# Patient Record
Sex: Male | Born: 1960
Health system: Southern US, Community
[De-identification: ages and names within clinical notes are randomized; demographics above are authoritative.]

## PROBLEM LIST (undated history)

## (undated) DIAGNOSIS — Z114 Encounter for screening for human immunodeficiency virus [HIV]: Secondary | ICD-10-CM

## (undated) DIAGNOSIS — F32A Depression, unspecified: Secondary | ICD-10-CM

## (undated) DIAGNOSIS — K219 Gastro-esophageal reflux disease without esophagitis: Secondary | ICD-10-CM

## (undated) DIAGNOSIS — M1990 Unspecified osteoarthritis, unspecified site: Secondary | ICD-10-CM

## (undated) DIAGNOSIS — E785 Hyperlipidemia, unspecified: Secondary | ICD-10-CM

## (undated) HISTORY — PX: BACK SURGERY: SHX140

## (undated) HISTORY — DX: Hyperlipidemia, unspecified: E78.5

## (undated) HISTORY — DX: Gastro-esophageal reflux disease without esophagitis: K21.9

---

## 1999-04-17 ENCOUNTER — Encounter: Payer: Self-pay | Admitting: Neurosurgery

## 1999-04-17 ENCOUNTER — Ambulatory Visit (HOSPITAL_COMMUNITY): Admission: RE | Admit: 1999-04-17 | Discharge: 1999-04-18 | Payer: Self-pay | Admitting: Neurosurgery

## 2004-03-07 ENCOUNTER — Emergency Department (HOSPITAL_COMMUNITY): Admission: EM | Admit: 2004-03-07 | Discharge: 2004-03-07 | Payer: Self-pay | Admitting: Emergency Medicine

## 2004-05-25 ENCOUNTER — Emergency Department (HOSPITAL_COMMUNITY): Admission: EM | Admit: 2004-05-25 | Discharge: 2004-05-25 | Payer: Self-pay | Admitting: Emergency Medicine

## 2004-06-15 ENCOUNTER — Emergency Department (HOSPITAL_COMMUNITY): Admission: EM | Admit: 2004-06-15 | Discharge: 2004-06-15 | Payer: Self-pay | Admitting: Emergency Medicine

## 2010-10-08 ENCOUNTER — Inpatient Hospital Stay (INDEPENDENT_AMBULATORY_CARE_PROVIDER_SITE_OTHER)
Admission: RE | Admit: 2010-10-08 | Discharge: 2010-10-08 | Disposition: A | Discharge status: Home / Self Care | Payer: Self-pay | Source: Ambulatory Visit | Attending: Family Medicine | Admitting: Family Medicine

## 2010-10-08 DIAGNOSIS — L02519 Cutaneous abscess of unspecified hand: Secondary | ICD-10-CM

## 2010-10-08 DIAGNOSIS — L03119 Cellulitis of unspecified part of limb: Secondary | ICD-10-CM

## 2010-10-11 LAB — WOUND CULTURE

## 2014-03-08 ENCOUNTER — Emergency Department (HOSPITAL_COMMUNITY)
Admission: EM | Admit: 2014-03-08 | Discharge: 2014-03-08 | Disposition: A | Discharge status: Home / Self Care | Payer: Self-pay | Attending: Family Medicine | Admitting: Family Medicine

## 2014-03-08 ENCOUNTER — Encounter (HOSPITAL_COMMUNITY): Payer: Self-pay | Admitting: Emergency Medicine

## 2014-03-08 DIAGNOSIS — F17200 Nicotine dependence, unspecified, uncomplicated: Secondary | ICD-10-CM | POA: Insufficient documentation

## 2014-03-08 DIAGNOSIS — M1990 Unspecified osteoarthritis, unspecified site: Secondary | ICD-10-CM

## 2014-03-08 DIAGNOSIS — M129 Arthropathy, unspecified: Secondary | ICD-10-CM | POA: Insufficient documentation

## 2014-03-08 DIAGNOSIS — Z5900 Homelessness unspecified: Secondary | ICD-10-CM | POA: Insufficient documentation

## 2014-03-08 DIAGNOSIS — R062 Wheezing: Secondary | ICD-10-CM | POA: Insufficient documentation

## 2014-03-08 DIAGNOSIS — Z79899 Other long term (current) drug therapy: Secondary | ICD-10-CM | POA: Insufficient documentation

## 2014-03-08 DIAGNOSIS — Z4802 Encounter for removal of sutures: Secondary | ICD-10-CM | POA: Insufficient documentation

## 2014-03-08 HISTORY — DX: Unspecified osteoarthritis, unspecified site: M19.90

## 2014-03-08 LAB — I-STAT CHEM 8, ED
BUN: 10 mg/dL (ref 6–23)
CALCIUM ION: 1.11 mmol/L — AB (ref 1.12–1.23)
Chloride: 103 mEq/L (ref 96–112)
Creatinine, Ser: 1.3 mg/dL (ref 0.50–1.35)
Glucose, Bld: 125 mg/dL — ABNORMAL HIGH (ref 70–99)
HCT: 54 % — ABNORMAL HIGH (ref 39.0–52.0)
Hemoglobin: 18.4 g/dL — ABNORMAL HIGH (ref 13.0–17.0)
Potassium: 5.1 mEq/L (ref 3.7–5.3)
Sodium: 138 mEq/L (ref 137–147)
TCO2: 28 mmol/L (ref 0–100)

## 2014-03-08 MED ORDER — KETOROLAC TROMETHAMINE 60 MG/2ML IM SOLN
60.0000 mg | Freq: Once | INTRAMUSCULAR | Status: AC
  Administered 2014-03-08: 60 mg via INTRAMUSCULAR
  Filled 2014-03-08: qty 2

## 2014-03-08 MED ORDER — MELOXICAM 7.5 MG PO TABS: 7.5000 mg | ORAL_TABLET | Freq: Every day | ORAL | Status: DC

## 2014-03-08 MED ORDER — DICLOFENAC SODIUM 1 % TD GEL: 2.0000 g | Freq: Four times a day (QID) | TRANSDERMAL | Status: DC

## 2014-03-08 NOTE — Discharge Instructions (Signed)
You are sufferinfg from arthritis. In 24 hours please start the meloxicam Please use the voltaren gel as needed for pain if you cannot take the meloxicam Please follow up with a regular doctor soon Perform daily stretching

## 2014-03-08 NOTE — ED Provider Notes (Signed)
CSN: [id number]     Arrival date & time 03/08/14  0932 History   First MD Initiated Contact with Patient 03/08/14 [phone]     No chief complaint on file.    (Consider location/radiation/quality/duration/timing/severity/associated sxs/prior Treatment) HPI  Multiple joint pains including R knee and hip and shoulders bilat. Ongoing for years. worsk manual labor. Pain typically relieved by aspirin 400mg . Pain is worse w/ increased movement. Achy pains. No radiation.  Staples in head. Laceration from hitting head on car during fall. Seen in [facility] and had staples placed in head. Tetanus shot at the time. Placed 14 days ago. Denies pian or discharge.   Past Medical History  Diagnosis Date  . Arthritis    Past Surgical History  Procedure Laterality Date  . Back surgery     No family history on file. History  Substance Use Topics  . Smoking status: Current Every Day Smoker -- 1.00 packs/day  . Smokeless tobacco: Not on file  . Alcohol Use: Yes    Review of Systems  Respiratory: Negative for chest tightness, wheezing and stridor.   Cardiovascular: Negative for chest pain and leg swelling.  Skin: Negative for rash and wound.  Neurological: Negative for headaches.  All other systems reviewed and are negative.     Allergies  Review of patient's allergies indicates no known allergies.  Home Medications   Prior to Admission medications   Medication Sig Start Date End Date Taking? Authorizing Provider  diclofenac sodium (VOLTAREN) 1 % GEL Apply 2 g topically 4 (four) times daily. 03/08/14   Waldemar Dickens, MD  meloxicam (MOBIC) 7.5 MG tablet Take 1 tablet (7.5 mg total) by mouth daily. 03/08/14   Waldemar Dickens, MD   BP 107/70  Pulse 74  Temp(Src) 97.4 F (36.3 C) (Oral)  SpO2 98% Physical Exam  Constitutional: He is oriented to person, place, and time. He appears well-developed and well-nourished. No distress.  HENT:  Head: Normocephalic and atraumatic.  Eyes: EOM are normal.  Pupils are equal, round, and reactive to light.  Neck: Normal range of motion.  Cardiovascular: Normal rate and normal heart sounds.   No murmur heard. Pulmonary/Chest: Effort normal. He has wheezes.  Abdominal: Soft. He exhibits no distension.  Musculoskeletal:  FROM. No effusions. nonttp (shoulders, knees, hip). Lachmann's neg bilat.   Neurological: He is alert and oriented to person, place, and time.  Skin: Skin is warm. He is not diaphoretic.  Psychiatric: He has a normal mood and affect. His behavior is normal. Judgment and thought content normal.    ED Course  Procedures (including critical care time) Labs Review Labs Reviewed  I-STAT CHEM 8, ED - Abnormal; Notable for the following:    Glucose, Bld 125 (*)    Calcium, Ion 1.11 (*)    Hemoglobin 18.4 (*)    HCT 54.0 (*)    All other components within normal limits    Imaging Review No results found.   EKG Interpretation None      MDM   Final diagnoses:  Arthritis  Removal of staples  Homeless   Arthralgias all likely from arthritis and living outside and on the ground. Unlikely PMR but possible  CHem 8 w/ nml renal fxn Toradol in office Start meloxicam in 24 hrs.  Voltaren gel Pt states he will be able to get meds through his pharmacy.  2 Staples removed from head w/o incident. Well healing scar on head w/ flaking scab. Precautions given and all questions answered  Shanon Brow  Marily Memos, MD Family Medicine 03/08/2014, 9:56 AM     Waldemar Dickens, MD 03/08/14 [phone]

## 2014-03-08 NOTE — ED Notes (Signed)
Pt c/o bilateral shoulder pain and right hip and knee pain x 3-4 weeks.No PCP. Had back surgery in 2004 here, per NS. ALSO-- pt has 2 staples in scalp from 1 week ago from outside facility.

## 2016-04-15 LAB — GLUCOSE, POCT (MANUAL RESULT ENTRY): POC GLUCOSE: 135 mg/dL — AB (ref 70–99)

## 2017-02-16 ENCOUNTER — Emergency Department (HOSPITAL_COMMUNITY): Payer: Self-pay

## 2017-02-16 ENCOUNTER — Encounter (HOSPITAL_COMMUNITY): Payer: Self-pay

## 2017-02-16 ENCOUNTER — Emergency Department (HOSPITAL_COMMUNITY)
Admission: EM | Admit: 2017-02-16 | Discharge: 2017-02-16 | Disposition: A | Discharge status: Home / Self Care | Payer: Self-pay | Attending: Emergency Medicine | Admitting: Emergency Medicine

## 2017-02-16 ENCOUNTER — Ambulatory Visit: Payer: Self-pay | Admitting: Emergency Medicine

## 2017-02-16 DIAGNOSIS — Y9269 Other specified industrial and construction area as the place of occurrence of the external cause: Secondary | ICD-10-CM | POA: Insufficient documentation

## 2017-02-16 DIAGNOSIS — S61442A Puncture wound with foreign body of left hand, initial encounter: Secondary | ICD-10-CM | POA: Insufficient documentation

## 2017-02-16 DIAGNOSIS — Y992 Volunteer activity: Secondary | ICD-10-CM | POA: Insufficient documentation

## 2017-02-16 DIAGNOSIS — Z79899 Other long term (current) drug therapy: Secondary | ICD-10-CM | POA: Insufficient documentation

## 2017-02-16 DIAGNOSIS — Z23 Encounter for immunization: Secondary | ICD-10-CM | POA: Insufficient documentation

## 2017-02-16 DIAGNOSIS — Y9389 Activity, other specified: Secondary | ICD-10-CM | POA: Insufficient documentation

## 2017-02-16 DIAGNOSIS — W294XXA Contact with nail gun, initial encounter: Secondary | ICD-10-CM | POA: Insufficient documentation

## 2017-02-16 DIAGNOSIS — S60552A Superficial foreign body of left hand, initial encounter: Secondary | ICD-10-CM

## 2017-02-16 DIAGNOSIS — F17200 Nicotine dependence, unspecified, uncomplicated: Secondary | ICD-10-CM | POA: Insufficient documentation

## 2017-02-16 MED ORDER — OXYCODONE-ACETAMINOPHEN 5-325 MG PO TABS
1.0000 | ORAL_TABLET | Freq: Once | ORAL | Status: AC
  Administered 2017-02-16: 1 via ORAL
  Filled 2017-02-16: qty 1

## 2017-02-16 MED ORDER — CEFAZOLIN SODIUM-DEXTROSE 2-4 GM/100ML-% IV SOLN
2.0000 g | Freq: Once | INTRAVENOUS | Status: AC
  Administered 2017-02-16: 2 g via INTRAVENOUS
  Filled 2017-02-16: qty 100

## 2017-02-16 MED ORDER — ACETAMINOPHEN 325 MG PO TABS: 650.0000 mg | ORAL_TABLET | Freq: Four times a day (QID) | ORAL | Status: DC | PRN

## 2017-02-16 MED ORDER — LIDOCAINE HCL 2 % IJ SOLN
10.0000 mL | Freq: Once | INTRAMUSCULAR | Status: AC
  Administered 2017-02-16: 200 mg via INTRADERMAL
  Filled 2017-02-16: qty 20

## 2017-02-16 MED ORDER — IBUPROFEN 200 MG PO TABS: 600.0000 mg | ORAL_TABLET | Freq: Four times a day (QID) | ORAL | Status: DC | PRN

## 2017-02-16 MED ORDER — TETANUS-DIPHTH-ACELL PERTUSSIS 5-2.5-18.5 LF-MCG/0.5 IM SUSP
0.5000 mL | Freq: Once | INTRAMUSCULAR | Status: AC
  Administered 2017-02-16: 0.5 mL via INTRAMUSCULAR
  Filled 2017-02-16: qty 0.5

## 2017-02-16 MED ORDER — CEPHALEXIN 500 MG PO CAPS: 500.0000 mg | ORAL_CAPSULE | Freq: Four times a day (QID) | ORAL | 0 refills | Status: AC

## 2017-02-16 NOTE — ED Triage Notes (Signed)
Pt presents for evaluation of nail to L hand/wrist. Pt was volunteering building homes and was using nail gun when a nail flew into skin. Positive PMS to L hand.

## 2017-02-16 NOTE — ED Provider Notes (Signed)
[facility] DEPT Provider Note   CSN: [id number] Arrival date & time: 02/16/17  1156  By signing my name below, I, Ephriam Jenkins, attest that this documentation has been prepared under the direction and in the presence of [facility] PA-C.  Electronically Signed: Ephriam Jenkins, ED Scribe. 02/16/17. 2:31 PM.  History   Chief Complaint Chief Complaint  Patient presents with  . nail in hand   HPI HPI Comments: Darren Wells is a 56 y.o. male who presents to the Emergency Department s/p an injury that occurred at approximately 1030 this morning. 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 He was using a nail gun when a nail accidentally punctured his left wrist. The nail punctured the volar aspect of the radial left wrist, and is still currently placed in the skin. The nail did not travel all the way through the wrist but pt states that he can feel the tip of the nail on the opposite side. He describes moderate, throbbing pain to the left wrist, with intermittent radiation of pain up to his left elbow. The pain is exacerbated with any movement of the wrist. His wrist pain is also exacerbated with movement of the fingers, significantly worse with movement of left thumb. Pt has limited ROM of the left wrist and digits of the left hand, secondary to pain. He denies any pain in the left hand or fingers. He denies any numbness or tingling. Pt is not UTD with Tetanus immunization. Pt is right hand dominant.   The history is provided by the patient. No language interpreter was used.    Past Medical History:  Diagnosis Date  . Arthritis     There are no active problems to display for this patient.   Past Surgical History:  Procedure Laterality Date  . BACK SURGERY         Home Medications    Prior to Admission medications   Medication Sig Start Date End Date Taking? Authorizing Provider  acetaminophen (TYLENOL) 325 MG tablet Take 2 tablets (650 mg total) by mouth every 6 (six) hours  as needed for mild pain or moderate pain. 02/16/17   Milly Jakob, MD  cephALEXin (KEFLEX) 500 MG capsule Take 1 capsule (500 mg total) by mouth 4 (four) times daily. 02/16/17 02/23/17  Rodell Perna A, PA-C  diclofenac sodium (VOLTAREN) 1 % GEL Apply 2 g topically 4 (four) times daily. 03/08/14   Waldemar Dickens, MD  ibuprofen (ADVIL) 200 MG tablet Take 3 tablets (600 mg total) by mouth every 6 (six) hours as needed for mild pain or moderate pain. 02/16/17   Milly Jakob, MD  meloxicam (MOBIC) 7.5 MG tablet Take 1 tablet (7.5 mg total) by mouth daily. 03/08/14   Waldemar Dickens, MD    Family History No family history on file.  Social History Social History  Substance Use Topics  . Smoking status: Current Every Day Smoker    Packs/day: 1.00  . Smokeless tobacco: Not on file  . Alcohol use Yes     Allergies   Patient has no known allergies.   Review of Systems Review of Systems  Musculoskeletal: Positive for arthralgias and myalgias. Negative for joint swelling.  Skin: Positive for wound.  Neurological: Negative for weakness and numbness.  Hematological: Does not bruise/bleed easily.     Physical Exam Updated Vital Signs BP 113/65 (BP Location: Right Arm)   Pulse 72   Temp 98.2 F (36.8 C) (Oral)   Resp 17   SpO2 98%  Physical Exam  Constitutional: He appears well-developed.  HENT:  Head: Normocephalic.  Eyes: Conjunctivae are normal.  Neck: Neck supple.  Cardiovascular: Normal rate, regular rhythm and intact distal pulses.   No murmur heard. 2+ radial pulses bilaterally  Pulmonary/Chest: Effort normal.  Abdominal: Soft. He exhibits no distension.  Musculoskeletal:  Nail located at the volar aspect of the left wrist near the radial styloid. I'm able to palpate the tip of the nail on the dorsum of the left hand, but it has not broken skin. No bleeding. Decreased range of motion of the left thumb secondary to pain. 5/5 strength of digits with flexion and extension  against resistance. Limited range of motion of the wrist due to pain and presence of foreign body. No tenderness on palpation of the left forearm. Normal range of motion of the elbow. No crepitus or swelling noted.  Neurological: He is alert.  Fluent speech, no facial droop, sensation intact to soft touch of bilateral upper extremities  Skin: Skin is warm and dry. Capillary refill takes less than 2 seconds.  Psychiatric: His behavior is normal.  Nursing note and vitals reviewed.    ED Treatments / Results  DIAGNOSTIC STUDIES: Oxygen Saturation is 97% on RA, normal by my interpretation.  COORDINATION OF CARE: 2:31 PM-Discussed treatment plan with pt at bedside and pt agreed to plan.   Labs (all labs ordered are listed, but only abnormal results are displayed) Labs Reviewed - No data to display  EKG  EKG Interpretation None       Radiology Dg Wrist Complete Left  Addendum Date: 02/16/2017   ADDENDUM REPORT: 02/16/2017 15:38 ADDENDUM: Comment:  Ulnar deviation scaphoid image also obtained. Electronically Signed   By: Lowella Grip III M.D.   On: 02/16/2017 15:38   Result Date: 02/16/2017 CLINICAL DATA:  Injury with nail gun EXAM: LEFT WRIST - COMPLETE 3+ VIEW COMPARISON:  None. FINDINGS: Frontal, oblique, and lateral views were obtained. There is a nail in the lateral aspect of the wrist region, located lateral to the distal scaphoid bone. The nail is not seen within bone. There is no evident fracture or dislocation. There is a small nonmetallic radiopaque foreign body lateral to the distal scaphoid bone. Joint spaces appear normal. No erosive change. IMPRESSION: Nail in soft tissues lateral to scaphoid bone without traversing bone. Small nonmetallic radiopaque foreign body lateral to the scaphoid. No evident fracture or dislocation. No appreciable arthropathy. Electronically Signed: By: Lowella Grip III M.D. On: 02/16/2017 15:31    Procedures Procedures (including critical  care time)  Medications Ordered in ED Medications  Tdap (BOOSTRIX) injection 0.5 mL (0.5 mLs Intramuscular Given 02/16/17 1431)  oxyCODONE-acetaminophen (PERCOCET/ROXICET) 5-325 MG per tablet 1 tablet (1 tablet Oral Given 02/16/17 1431)  ceFAZolin (ANCEF) IVPB 2g/100 mL premix (0 g Intravenous Stopped 02/16/17 1724)  lidocaine (XYLOCAINE) 2 % (with pres) injection 200 mg (200 mg Intradermal Given 02/16/17 1657)     Initial Impression / Assessment and Plan / ED Course  I have reviewed the triage vital signs and the nursing notes.  Pertinent labs & imaging results that were available during my care of the patient were reviewed by me and considered in my medical decision making (see chart for details).     Patient with nail inserted into the left wrist while using a nail gun this morning. Afebrile, vital signs are stable, and he is neurovascularly intact. Limited range of motion due to pain. Radiographs reviewed by me show no evidence of fracture  or dislocation. Tetanus was updated. 4:22 PM Spoke with Dr. Grandville Silos with hand surgery who agrees to come down to evaluate patient for possible nail removal.   5:28 PM Dr. Grandville Silos performed nail removal and irrigation. Patient given IV Ancef while in the ED. He is stable for discharge home with 7 day course of Keflex and follow-up in Dr. Biagio Borg office in 2 days for wound check. Discussed indications for return to the ED with patient. Pt verbalized understanding of and agreement with plan and is safe for discharge home at this time.   Final Clinical Impressions(s) / ED Diagnoses   Final diagnoses:  Foreign body of left hand, initial encounter    New Prescriptions Discharge Medication List as of 02/16/2017  5:37 PM    START taking these medications   Details  acetaminophen (TYLENOL) 325 MG tablet Take 2 tablets (650 mg total) by mouth every 6 (six) hours as needed for mild pain or moderate pain., Starting Mon 02/16/2017, OTC    cephALEXin (KEFLEX)  500 MG capsule Take 1 capsule (500 mg total) by mouth 4 (four) times daily., Starting Mon 02/16/2017, Until Mon 02/23/2017, Print    ibuprofen (ADVIL) 200 MG tablet Take 3 tablets (600 mg total) by mouth every 6 (six) hours as needed for mild pain or moderate pain., Starting Mon 02/16/2017, OTC      I personally performed the services described in this documentation, which was scribed in my presence. The recorded information has been reviewed and is accurate.     Renita Papa, PA-C 02/16/17 1807    Quintella Reichert, MD 02/16/17 2114

## 2017-02-16 NOTE — ED Notes (Signed)
PA Mina at bedside.

## 2017-02-16 NOTE — Consult Note (Signed)
ORTHOPAEDIC CONSULTATION HISTORY & PHYSICAL REQUESTING PHYSICIAN: Quintella Reichert, MD  Chief Complaint: Nail bed injury to left volar wrist region  HPI: Darren Wells is a 56 y.o. male who sustained a held and injury earlier today, with a long ridged nail lodging into the volar radial aspect of his distal forearm.  Present emergency department for evaluation.  Tetanus was updated.  Past Medical History:  Diagnosis Date  . Arthritis    Past Surgical History:  Procedure Laterality Date  . BACK SURGERY     Social History   Social History  . Marital status: Married    Spouse name: N/A  . Number of children: N/A  . Years of education: N/A   Social History Main Topics  . Smoking status: Current Every Day Smoker    Packs/day: 1.00  . Smokeless tobacco: None  . Alcohol use Yes  . Drug use: No  . Sexual activity: Not Asked   Other Topics Concern  . None   Social History Narrative  . None   No family history on file. No Known Allergies Prior to Admission medications   Medication Sig Start Date End Date Taking? Authorizing Provider  cephALEXin (KEFLEX) 500 MG capsule Take 1 capsule (500 mg total) by mouth 4 (four) times daily. 02/16/17 02/23/17  Rodell Perna A, PA-C  diclofenac sodium (VOLTAREN) 1 % GEL Apply 2 g topically 4 (four) times daily. 03/08/14   Waldemar Dickens, MD  meloxicam (MOBIC) 7.5 MG tablet Take 1 tablet (7.5 mg total) by mouth daily. 03/08/14   Waldemar Dickens, MD   Dg Wrist Complete Left  Addendum Date: 02/16/2017   ADDENDUM REPORT: 02/16/2017 15:38 ADDENDUM: Comment:  Ulnar deviation scaphoid image also obtained. Electronically Signed   By: Lowella Grip III M.D.   On: 02/16/2017 15:38   Result Date: 02/16/2017 CLINICAL DATA:  Injury with nail gun EXAM: LEFT WRIST - COMPLETE 3+ VIEW COMPARISON:  None. FINDINGS: Frontal, oblique, and lateral views were obtained. There is a nail in the lateral aspect of the wrist region, located lateral to the distal scaphoid  bone. The nail is not seen within bone. There is no evident fracture or dislocation. There is a small nonmetallic radiopaque foreign body lateral to the distal scaphoid bone. Joint spaces appear normal. No erosive change. IMPRESSION: Nail in soft tissues lateral to scaphoid bone without traversing bone. Small nonmetallic radiopaque foreign body lateral to the scaphoid. No evident fracture or dislocation. No appreciable arthropathy. Electronically Signed: By: Lowella Grip III M.D. On: 02/16/2017 15:31    Positive ROS: All other systems have been reviewed and were otherwise negative with the exception of those mentioned in the HPI and as above.  Physical Exam: Vitals: Refer to EMR. Constitutional:  WD, WN, NAD HEENT:  NCAT, EOMI Neuro/Psych:  Alert & oriented to person, place, and time; appropriate mood & affect Lymphatic: No generalized extremity edema or lymphadenopathy Extremities / MSK:  The extremities are normal with respect to appearance, ranges of motion, joint stability, muscle strength/tone, sensation, & perfusion except as otherwise noted:  There is a rather large framing type nail with ridges protruding from the volar radial aspect of his wrist region.  It is directed radially and dorsally from its entrance.  Intact light touch sensibility on the radial and ulnar aspects of all the digital tips including the thumb.  All flexor tendons including FPL are intact.  Radial pulses palpable.  Digits well perfused.  No bleeding from the site.  Thenar muscles  fire.  Assessment: Nail to left distal forearm/wrist region, without significant fracture.  Plan: I discussed these findings with him.  I anesthetized just proximal to the insertion site and also in a ring around the radial dorsal aspect of the wrist to include the branches of the superficial radial nerve and lateral antebrachial cutaneous nerve.  The nail was manually extracted without incident.  No change in neurovascular exam, no  active bleeding following this.  The nail path was irrigated copiously via a 14-gauge Angiocath.  A dressing was applied.  He received a dose of Ancef in the emergency department will be discharged with Keflex.  I instructed him in the extreme importance of beginning his antibiotic therapy, infection precautions given, will arrange follow-up for 48-72 hours.  [name], [place], [place]  [address] Office: [phone] Mobile: [phone]  02/16/2017, 5:28 PM

## 2019-02-09 ENCOUNTER — Ambulatory Visit (INDEPENDENT_AMBULATORY_CARE_PROVIDER_SITE_OTHER): Payer: Self-pay | Admitting: Family Medicine

## 2019-02-09 ENCOUNTER — Other Ambulatory Visit: Payer: Self-pay

## 2019-02-09 ENCOUNTER — Encounter: Payer: Self-pay | Admitting: Family Medicine

## 2019-02-09 VITALS — BP 126/64 | HR 85 | Ht 77.0 in | Wt 188.0 lb

## 2019-02-09 DIAGNOSIS — M1990 Unspecified osteoarthritis, unspecified site: Secondary | ICD-10-CM | POA: Insufficient documentation

## 2019-02-09 DIAGNOSIS — Z13220 Encounter for screening for lipoid disorders: Secondary | ICD-10-CM | POA: Insufficient documentation

## 2019-02-09 DIAGNOSIS — Z114 Encounter for screening for human immunodeficiency virus [HIV]: Secondary | ICD-10-CM | POA: Insufficient documentation

## 2019-02-09 DIAGNOSIS — K219 Gastro-esophageal reflux disease without esophagitis: Secondary | ICD-10-CM | POA: Insufficient documentation

## 2019-02-09 DIAGNOSIS — Z1211 Encounter for screening for malignant neoplasm of colon: Secondary | ICD-10-CM | POA: Insufficient documentation

## 2019-02-09 DIAGNOSIS — Z1159 Encounter for screening for other viral diseases: Secondary | ICD-10-CM

## 2019-02-09 DIAGNOSIS — Z131 Encounter for screening for diabetes mellitus: Secondary | ICD-10-CM | POA: Insufficient documentation

## 2019-02-09 DIAGNOSIS — Z0000 Encounter for general adult medical examination without abnormal findings: Secondary | ICD-10-CM

## 2019-02-09 DIAGNOSIS — Z1212 Encounter for screening for malignant neoplasm of rectum: Secondary | ICD-10-CM

## 2019-02-09 DIAGNOSIS — M170 Bilateral primary osteoarthritis of knee: Secondary | ICD-10-CM

## 2019-02-09 DIAGNOSIS — F17200 Nicotine dependence, unspecified, uncomplicated: Secondary | ICD-10-CM

## 2019-02-09 MED ORDER — FAMOTIDINE 20 MG PO TABS: 20.0000 mg | ORAL_TABLET | Freq: Two times a day (BID) | ORAL | 2 refills | Status: DC

## 2019-02-09 NOTE — Assessment & Plan Note (Signed)
-  Follow-up HIV

## 2019-02-09 NOTE — Assessment & Plan Note (Signed)
Follow-up lipid panel 

## 2019-02-09 NOTE — Assessment & Plan Note (Signed)
-  Prescribed famotidine 20 mg twice daily

## 2019-02-09 NOTE — Assessment & Plan Note (Signed)
He is not familiar with his family history because he spent the majority of his upbringing in foster care.  He is concerned that he may have a propensity for diabetes. -Follow-up A1c, BMP

## 2019-02-09 NOTE — Assessment & Plan Note (Signed)
Knee x-ray in 2005 shows mild joint space narrowing.  Pain well controlled for now. -NSAIDs as needed

## 2019-02-09 NOTE — Progress Notes (Signed)
    Subjective:  Darren Wells is a 58 y.o. male who presents to the [facility] today for a new patient encounter.   HPI:  GERD Darren Wells reports a history of reflux for which he has previously been medicated.  He does not member his previous medication but he recalls it was something to reduce the acid in his stomach.  Currently he experiences mild epigastric burning at night while lying down.  He also experiences occasional bouts of NB/NB emesis.  The last episode of emesis was several months ago.  Nicotine use d/o He reports a history of smoking cigarettes since his 66s.  He currently smokes 1/2 pack/day.  Does occasionally smoke cigars.  He has no interest in smoking cessation at this time.  He denies other recreational drug use.  Healthcare maintenance  He is open to colonoscopy for colon cancer screening.  Osteoarthritis, bilateral knees With history of arthritis in his knees.  He reports that he rarely takes pain medication but does occasionally use Tylenol for pain.  He is not currently experiencing significant knee pain.  Chief Complaint noted Review of Symptoms - see HPI PMH - Smoking status noted.    Objective:  Physical Exam: BP 126/64   Pulse 85   Ht 6\' 5"  (1.956 m)   Wt 188 lb (85.3 kg)   SpO2 97%   BMI 22.29 kg/m    Gen: NAD, resting comfortably CV: RRR with no murmurs appreciated Pulm: NWOB, CTAB with no crackles, or rhonchi GI: Normal bowel sounds present. Soft, Nontender, Nondistended. MSK: no edema, cyanosis, or clubbing noted Skin: warm, dry Neuro: grossly normal, moves all extremities   No results found for this or any previous visit (from the past 72 hour(s)).   Assessment/Plan:  GERD (gastroesophageal reflux disease) -Prescribed famotidine 20 mg twice daily  Screening for colorectal cancer -Referral placed to GI for colonoscopy  Nicotine use disorder -Encourage smoking cessation, patient not interested -Continue to discuss smoking cessation at  subsequent visits -We will follow-up to address pack-year history and consider low-dose CT for lung cancer screening.  Screening for HIV (human immunodeficiency virus) -Follow-up HIV  Screening for hyperlipidemia -Follow-up lipid panel  Screening for diabetes mellitus He is not familiar with his family history because he spent the majority of his upbringing in foster care.  He is concerned that he may have a propensity for diabetes. -Follow-up A1c, BMP  Osteoarthritis Knee x-ray in 2005 shows mild joint space narrowing.  Pain well controlled for now. -NSAIDs as needed   Bunion Needed bunion on his right foot she did not have time to address this visit.  He was encouraged to make return visit to further address this discomfort.

## 2019-02-09 NOTE — Assessment & Plan Note (Signed)
-  Referral placed to GI for colonoscopy.

## 2019-02-09 NOTE — Assessment & Plan Note (Addendum)
-  Encourage smoking cessation, patient not interested -Continue to discuss smoking cessation at subsequent visits -We will follow-up to address pack-year history and consider low-dose CT for lung cancer screening.

## 2019-02-09 NOTE — Patient Instructions (Signed)
It was great to meet you today.  Here's a quick summary of what we went over:  Screening for colon cancer: You will get a call in the next week about an appointment for a colonoscopy.  Call us back if you don't hear anything in the next week.  Reflux:  It does sound like you may have an element of heart burn.  You can start taking Famotidine once in the morning and once in the evening.  Lab work:  I will call you if any of you lab work is abnormal.  Otherwise we will send you a letter.  Please consider smoking cessation.  It is the best thing you can do for your health right now.

## 2019-02-10 ENCOUNTER — Other Ambulatory Visit: Payer: Self-pay | Admitting: Family Medicine

## 2019-02-10 DIAGNOSIS — E785 Hyperlipidemia, unspecified: Secondary | ICD-10-CM

## 2019-02-10 DIAGNOSIS — Z122 Encounter for screening for malignant neoplasm of respiratory organs: Secondary | ICD-10-CM

## 2019-02-10 LAB — LIPID PANEL
Chol/HDL Ratio: 6.3 ratio — ABNORMAL HIGH (ref 0.0–5.0)
Cholesterol, Total: 227 mg/dL — ABNORMAL HIGH (ref 100–199)
HDL: 36 mg/dL — ABNORMAL LOW (ref >39)
LDL Calculated: 155 mg/dL — ABNORMAL HIGH (ref 0–99)
Triglycerides: 181 mg/dL — ABNORMAL HIGH (ref 0–149)
VLDL Cholesterol Cal: 36 mg/dL (ref 5–40)

## 2019-02-10 LAB — BASIC METABOLIC PANEL
BUN/Creatinine Ratio: 7 — ABNORMAL LOW (ref 9–20)
BUN: 10 mg/dL (ref 6–24)
CO2: 22 mmol/L (ref 20–29)
Calcium: 9.1 mg/dL (ref 8.7–10.2)
Chloride: 104 mmol/L (ref 96–106)
Creatinine, Ser: 1.4 mg/dL — ABNORMAL HIGH (ref 0.76–1.27)
GFR calc Af Amer: 64 mL/min/{1.73_m2} (ref >59)
GFR calc non Af Amer: 55 mL/min/{1.73_m2} — ABNORMAL LOW (ref >59)
Glucose: 70 mg/dL (ref 65–99)
Potassium: 4.2 mmol/L (ref 3.5–5.2)
Sodium: 142 mmol/L (ref 134–144)

## 2019-02-10 LAB — HEMOGLOBIN A1C
Est. average glucose Bld gHb Est-mCnc: 105 mg/dL
Hgb A1c MFr Bld: 5.3 % (ref 4.8–5.6)

## 2019-02-10 LAB — HCV COMMENT:

## 2019-02-10 LAB — HEPATITIS C ANTIBODY (REFLEX): HCV Ab: 0.1 s/co ratio (ref 0.0–0.9)

## 2019-02-10 MED ORDER — ATORVASTATIN CALCIUM 20 MG PO TABS: 20.0000 mg | ORAL_TABLET | Freq: Every day | ORAL | 3 refills | Status: DC

## 2019-02-10 NOTE — Progress Notes (Signed)
Patient informed of appt.  [place],CMA

## 2019-02-10 NOTE — Progress Notes (Signed)
Spoke with Darren Wells over the phone today.  He reports that he has been smoking cigarettes for the past 35 years.  He smoked as many as 2 packs/day for at least 6 years.  Since then, he has decreased no smokes 1/2 pack/day.  He was told that he meets criteria for lung cancer screening in the process of a low-dose CT was explained.  He was agreeable to have a low-dose CT chest performed.  We also discussed his lipid panel and his risk factors for cardiovascular disease.  He was informed that he was at moderate risk for a cardiovascular event in the next 10 years.  His ten-year risk is 16.6%.  He was informed that starting statin therapy could be helpful in preventing future cardiac events.  Is also informed that the most common side effect of statin therapy is related to muscle pains and liver damage.  He was agreeable to starting statin therapy.  -Low-dose CT chest for lung cancer screening ordered -We will schedule appointment for CT chest and follow-up with patient -Atorvastatin 20 mg daily ordered  Matilde Haymaker, MD

## 2019-02-18 ENCOUNTER — Ambulatory Visit
Admission: RE | Admit: 2019-02-18 | Discharge: 2019-02-18 | Disposition: A | Discharge status: Home / Self Care | Payer: No Typology Code available for payment source | Source: Ambulatory Visit | Attending: Family Medicine | Admitting: Family Medicine

## 2019-02-18 DIAGNOSIS — Z122 Encounter for screening for malignant neoplasm of respiratory organs: Secondary | ICD-10-CM

## 2019-02-22 ENCOUNTER — Encounter: Payer: Self-pay | Admitting: Family Medicine

## 2019-03-17 ENCOUNTER — Encounter: Payer: Self-pay | Admitting: Gastroenterology

## 2019-03-31 ENCOUNTER — Other Ambulatory Visit: Payer: Self-pay

## 2019-03-31 ENCOUNTER — Ambulatory Visit (AMBULATORY_SURGERY_CENTER): Payer: Self-pay | Admitting: *Deleted

## 2019-03-31 VITALS — Temp 98.1°F | Ht 77.0 in | Wt 195.0 lb

## 2019-03-31 DIAGNOSIS — Z1211 Encounter for screening for malignant neoplasm of colon: Secondary | ICD-10-CM

## 2019-03-31 MED ORDER — PEG 3350-KCL-NA BICARB-NACL 420 G PO SOLR: 4000.0000 mL | Freq: Once | ORAL | 0 refills | Status: AC

## 2019-03-31 MED ORDER — BISACODYL EC 5 MG PO TBEC: 5.0000 mg | DELAYED_RELEASE_TABLET | Freq: Once | ORAL | 0 refills | Status: AC

## 2019-03-31 MED FILL — PEG-3350 SOLUTION: 420 | 30 days supply | Qty: 4000 | Fill #0

## 2019-03-31 NOTE — Progress Notes (Signed)
Patient is here in-person for PV. Patient denies any allergies to eggs or soy. Patient denies any problems with anesthesia/sedation. Patient denies any oxygen use at home. Patient denies taking any diet/weight loss medications or blood thinners. Patient is not being treated for MRSA or C-diff. EMMI education assisgned to patient on colonoscopy, this was explained and instructions given to patient.   Pt is aware that care partner will wait in the car during procedure; if they feel like they will be too hot to wait in the car; they may wait in the lobby.  We want them to wear a mask (we do not have any that we can provide them), practice social distancing, and we will check their temperatures when they get here.  I did remind patient that their care partner needs to stay in the parking lot the entire time. Pt will wear mask into building.

## 2019-04-05 ENCOUNTER — Telehealth: Payer: Self-pay

## 2019-04-05 NOTE — Telephone Encounter (Signed)
Covid-19 screening questions   Do you now or have you had a fever in the last 14 days?  Do you have any respiratory symptoms of shortness of breath or cough now or in the last 14 days?  Do you have any family members or close contacts with diagnosed or suspected Covid-19 in the past 14 days?  Have you been tested for Covid-19 and found to be positive?       

## 2019-04-05 NOTE — Telephone Encounter (Signed)
Pt Answered "NO to all covid quetions

## 2019-04-06 ENCOUNTER — Ambulatory Visit (AMBULATORY_SURGERY_CENTER): Payer: Self-pay | Admitting: Gastroenterology

## 2019-04-06 ENCOUNTER — Other Ambulatory Visit: Payer: Self-pay

## 2019-04-06 ENCOUNTER — Encounter: Payer: Self-pay | Admitting: Gastroenterology

## 2019-04-06 ENCOUNTER — Other Ambulatory Visit: Payer: Self-pay | Admitting: Gastroenterology

## 2019-04-06 ENCOUNTER — Telehealth: Payer: Self-pay | Admitting: Gastroenterology

## 2019-04-06 VITALS — BP 125/67 | HR 49 | Temp 97.9°F | Resp 21 | Ht 77.0 in | Wt 195.0 lb

## 2019-04-06 DIAGNOSIS — D128 Benign neoplasm of rectum: Secondary | ICD-10-CM

## 2019-04-06 DIAGNOSIS — D127 Benign neoplasm of rectosigmoid junction: Secondary | ICD-10-CM

## 2019-04-06 DIAGNOSIS — D129 Benign neoplasm of anus and anal canal: Secondary | ICD-10-CM

## 2019-04-06 DIAGNOSIS — K635 Polyp of colon: Secondary | ICD-10-CM

## 2019-04-06 DIAGNOSIS — T801XXS Vascular complications following infusion, transfusion and therapeutic injection, sequela: Secondary | ICD-10-CM

## 2019-04-06 DIAGNOSIS — Z1211 Encounter for screening for malignant neoplasm of colon: Secondary | ICD-10-CM

## 2019-04-06 DIAGNOSIS — D122 Benign neoplasm of ascending colon: Secondary | ICD-10-CM

## 2019-04-06 DIAGNOSIS — D123 Benign neoplasm of transverse colon: Secondary | ICD-10-CM

## 2019-04-06 MED ORDER — OXYCODONE-ACETAMINOPHEN 10-325 MG PO TABS: 1.0000 | ORAL_TABLET | Freq: Four times a day (QID) | ORAL | 0 refills | Status: AC | PRN

## 2019-04-06 MED ORDER — IBUPROFEN 200 MG PO TABS: 400.0000 mg | ORAL_TABLET | Freq: Once | ORAL | Status: DC

## 2019-04-06 MED ORDER — SODIUM CHLORIDE 0.9 % IV SOLN: 500.0000 mL | Freq: Once | INTRAVENOUS | Status: DC

## 2019-04-06 NOTE — Progress Notes (Signed)
400mg  of Advil gel caps given in recovery per Dr. Rush Landmark. Heat packs times 2 applied at site of IV site. Pain scale of 10 when arrived in recovery now its a 2. Redness around site of infiltration. Instructed to continue to apply heat after discharge. Notify if symptoms worsen.

## 2019-04-06 NOTE — Progress Notes (Signed)
Called to room to assist during endoscopic procedure.  Patient ID and intended procedure confirmed with present staff. Received instructions for my participation in the procedure from the performing physician.  

## 2019-04-06 NOTE — Op Note (Signed)
[facility] Patient Name: Darren Wells Procedure Date: 04/06/2019 1:07 PM MRN: [id number] Endoscopist: Justice Britain , MD Age: 58 Referring MD:  Date of Birth: May 10, 1961 Gender: Male Account #: [id number] Procedure:                Colonoscopy Indications:              Screening for colorectal malignant neoplasm Medicines:                Monitored Anesthesia Care Procedure:                Pre-Anesthesia Assessment:                           - Prior to the procedure, a History and Physical                            was performed, and patient medications and                            allergies were reviewed. The patient's tolerance of                            previous anesthesia was also reviewed. The risks                            and benefits of the procedure and the sedation                            options and risks were discussed with the patient.                            All questions were answered, and informed consent                            was obtained. Prior Anticoagulants: The patient has                            taken no previous anticoagulant or antiplatelet                            agents. ASA Grade Assessment: III - A patient with                            severe systemic disease. After reviewing the risks                            and benefits, the patient was deemed in                            satisfactory condition to undergo the procedure.                           After obtaining informed consent, the colonoscope  was passed under direct vision. Throughout the                            procedure, the patient's blood pressure, pulse, and                            oxygen saturations were monitored continuously. The                            Colonoscope was introduced through the anus and                            advanced to the 5 cm into the ileum. The                            colonoscopy was  performed without difficulty. The                            patient tolerated the procedure. The quality of the                            bowel preparation was good. The terminal ileum,                            ileocecal valve, appendiceal orifice, and rectum                            were photographed. Scope In: 1:14:45 PM Scope Out: 1:36:45 PM Scope Withdrawal Time: 0 hours 18 minutes 53 seconds  Total Procedure Duration: 0 hours 22 minutes 0 seconds  Findings:                 The digital rectal exam findings include                            hemorrhoids. Pertinent negatives include no                            palpable rectal lesions.                           The terminal ileum and ileocecal valve appeared                            normal.                           Five sessile polyps were found in the rectum (1),                            recto-sigmoid colon (1), transverse colon (1),                            hepatic flexure (1), and ascending colon (1). The  polyps were 2 to 7 mm in size. These polyps were                            removed with a cold snare. Resection and retrieval                            were complete.                           Normal mucosa was found in the entire colon                            otherwise.                           Non-bleeding non-thrombosed internal hemorrhoids                            were found during retroflexion, during perianal                            exam and during digital exam. The hemorrhoids were                            Grade II (internal hemorrhoids that prolapse but                            reduce spontaneously). Complications:            No immediate complications. Estimated Blood Loss:     Estimated blood loss was minimal. Impression:               - Hemorrhoids found on digital rectal exam.                           - The examined portion of the ileum was normal.                            - Five 2 to 7 mm polyps in the rectum, at the                            recto-sigmoid colon, in the transverse colon, at                            the hepatic flexure and in the ascending colon,                            removed with a cold snare. Resected and retrieved.                           - Normal mucosa in the entire examined colon                            otherwise.                           -  Non-bleeding non-thrombosed internal hemorrhoids. Recommendation:           - The patient will be observed post-procedure,                            until all discharge criteria are met.                           - Discharge patient to home.                           - Patient has a contact number available for                            emergencies. The signs and symptoms of potential                            delayed complications were discussed with the                            patient. Return to normal activities tomorrow.                            Written discharge instructions were provided to the                            patient.                           - High fiber diet.                           - Use FiberCon 1 tablet PO daily.                           - Continue present medications.                           - Await pathology results.                           - Repeat colonoscopy in 3 - 5 years for                            surveillance based on pathology results and                            adenomatous tissue.                           - The findings and recommendations were discussed                            with the patient. Justice Britain, MD 04/06/2019 1:44:04 PM

## 2019-04-06 NOTE — Telephone Encounter (Signed)
[facility] called to inform that they do not disperse narcotics.

## 2019-04-06 NOTE — Progress Notes (Signed)
Pt Drowsy. VSS. To PACU, report to RN. No anesthetic complications noted.  

## 2019-04-06 NOTE — Progress Notes (Signed)
[place]

## 2019-04-06 NOTE — Patient Instructions (Signed)
Discharge instructions given. Handouts on polyps and a high fiber diet. Use Fiber Con 1 tablet every day. Resume previous medications. YOU HAD AN ENDOSCOPIC PROCEDURE TODAY AT [place]  [facility]:   Refer to the procedure report that was given to you for any specific questions about what was found during the examination.  If the procedure report does not answer your questions, please call your gastroenterologist to clarify.  If you requested that your care partner not be given the details of your procedure findings, then the procedure report has been included in a sealed envelope for you to review at your convenience later.  YOU SHOULD EXPECT: Some feelings of bloating in the abdomen. Passage of more gas than usual.  Walking can help get rid of the air that was put into your GI tract during the procedure and reduce the bloating. If you had a lower endoscopy (such as a colonoscopy or flexible sigmoidoscopy) you may notice spotting of blood in your stool or on the toilet paper. If you underwent a bowel prep for your procedure, you may not have a normal bowel movement for a few days.  Please Note:  You might notice some irritation and congestion in your nose or some drainage.  This is from the oxygen used during your procedure.  There is no need for concern and it should clear up in a day or so.  SYMPTOMS TO REPORT IMMEDIATELY:   Following lower endoscopy (colonoscopy or flexible sigmoidoscopy):  Excessive amounts of blood in the stool  Significant tenderness or worsening of abdominal pains  Swelling of the abdomen that is new, acute  Fever of 100F or higher   For urgent or emergent issues, a gastroenterologist can be reached at any hour by calling [phone].   DIET:  We do recommend a small meal at first, but then you may proceed to your regular diet.  Drink plenty of fluids but you should avoid alcoholic beverages for 24 hours.  ACTIVITY:  You should plan to take it easy for  the rest of today and you should NOT DRIVE or use heavy machinery until tomorrow (because of the sedation medicines used during the test).    FOLLOW UP: Our staff will call the number listed on your records 48-72 hours following your procedure to check on you and address any questions or concerns that you may have regarding the information given to you following your procedure. If we do not reach you, we will leave a message.  We will attempt to reach you two times.  During this call, we will ask if you have developed any symptoms of COVID 19. If you develop any symptoms (ie: fever, flu-like symptoms, shortness of breath, cough etc.) before then, please call [phone].  If you test positive for Covid 19 in the 2 weeks post procedure, please call and report this information to [place].    If any biopsies were taken you will be contacted by phone or by letter within the next 1-3 weeks.  Please call us at [phone] if you have not heard about the biopsies in 3 weeks.    SIGNATURES/CONFIDENTIALITY: You and/or your care partner have signed paperwork which will be entered into your electronic medical record.  These signatures attest to the fact that that the information above on your After Visit Summary has been reviewed and is understood.  Full responsibility of the confidentiality of this discharge information lies with you and/or your care-partner.

## 2019-04-06 NOTE — Progress Notes (Signed)
Pt's states no medical or surgical changes since previsit or office visit. 

## 2019-04-07 ENCOUNTER — Telehealth: Payer: Self-pay

## 2019-04-07 NOTE — Telephone Encounter (Signed)
Thank you for the update Patty. I am glad that his IV infiltration pain is improved.   Thanks. GM

## 2019-04-07 NOTE — Progress Notes (Signed)
Patient was seen and evaluated on multiple occasions after his procedure.  He had significant discomfort in his right upper extremity at the region of his previous IV which looked as if it had infiltrated.  Warm compresses were placed on the area with improvement in pain and discomfort.  Patient had good muscle strength without tingling sensation in the hand itself.  At the end of the patient's recovery his discomfort was 2 out of 10.  He had received oral Advil for pain relief.  He was prescribed a pain medication which was sent to the pharmacy for 2 days should he still have pain.  He was directed to continue to put warm compresses in the region for the next 24 to 48 hours.  He was also directed that if at any time point the arm began to swell more significantly he had more significant pain that was not tolerable with pain medication or he had any decrease in strength or progressive coolness of the extremity to come in directly to the emergency room and call our office to let us know. On 9/24, we will reach out to the patient through his friend and subsequently see how he is doing. I apologized for the patient's sense of unwellness a result of the IV infiltration.  He did very well throughout the colonoscopy otherwise except for the last 3 to 4 minutes of the procedure.

## 2019-04-07 NOTE — Telephone Encounter (Signed)
I spoke with the pt and he tells me he is doing fine. No pain.  He will call back if he has any further complications.

## 2019-04-07 NOTE — Telephone Encounter (Signed)
-----   Message from Irving Copas., MD sent at 04/07/2019  3:21 AM EDT ----- Regarding: Follow-up Darren Wells,Can you please check on this patient by calling his number or finding out with the nurses yesterday the contact information for his friend.He had significant pain and discomfort at the end of his colonoscopy and during his recovery phase as a result of an IV infiltration.Please give me an update and if he has any progressive issues or is having significant pain that is not controlled with the pain medicine he was prescribed he should go to the emergency department to be further evaluated.Thank you.GM

## 2019-04-08 ENCOUNTER — Telehealth: Payer: Self-pay

## 2019-04-08 NOTE — Telephone Encounter (Signed)
Attempted to reach pt. With follow-up call following endoscopic procedure 04/06/2019.  LM on pt. Ans. Machine.  Will try to reach pt. Again later today.

## 2019-04-08 NOTE — Telephone Encounter (Signed)
  Follow up Call-  Call back number 04/06/2019  Post procedure Call Back phone  # [phone]   friends number OK to call this number  Permission to leave phone message Yes  Some recent data might be hidden     Patient questions:  Do you have a fever, pain , or abdominal swelling? No. Pain Score  0 *  Have you tolerated food without any problems? Yes.    Have you been able to return to your normal activities? Yes.    Do you have any questions about your discharge instructions: Diet   No. Medications  No. Follow up visit  No.  Do you have questions or concerns about your Care? No.  Actions: * If pain score is 4 or above: No action needed, pain <4. 1. Have you developed a fever since your procedure? no  2.   Have you had an respiratory symptoms (SOB or cough) since your procedure? no  3.   Have you tested positive for COVID 19 since your procedure no  4.   Have you had any family members/close contacts diagnosed with the COVID 19 since your procedure?  no   If yes to any of these questions please route to Joylene John, RN and Alphonsa Gin, Therapist, sports.

## 2019-04-11 ENCOUNTER — Encounter: Payer: Self-pay | Admitting: Gastroenterology

## 2020-02-04 LAB — GLUCOSE, POCT (MANUAL RESULT ENTRY): POC Glucose: 98 mg/dl (ref 70–99)

## 2020-06-05 LAB — GLUCOSE, POCT (MANUAL RESULT ENTRY): POC Glucose: 105 mg/dl — AB (ref 70–99)

## 2020-08-15 ENCOUNTER — Encounter (HOSPITAL_COMMUNITY): Payer: Self-pay | Admitting: Emergency Medicine

## 2020-08-15 ENCOUNTER — Other Ambulatory Visit: Payer: Self-pay

## 2020-08-15 ENCOUNTER — Ambulatory Visit (HOSPITAL_COMMUNITY)
Admission: EM | Admit: 2020-08-15 | Discharge: 2020-08-15 | Disposition: A | Discharge status: Home / Self Care | Payer: No Payment, Other | Attending: Registered Nurse | Admitting: Registered Nurse

## 2020-08-15 ENCOUNTER — Ambulatory Visit (INDEPENDENT_AMBULATORY_CARE_PROVIDER_SITE_OTHER): Payer: No Payment, Other | Admitting: Clinical

## 2020-08-15 DIAGNOSIS — F32A Depression, unspecified: Secondary | ICD-10-CM | POA: Diagnosis present

## 2020-08-15 DIAGNOSIS — F4323 Adjustment disorder with mixed anxiety and depressed mood: Secondary | ICD-10-CM | POA: Diagnosis not present

## 2020-08-15 DIAGNOSIS — F324 Major depressive disorder, single episode, in partial remission: Secondary | ICD-10-CM

## 2020-08-15 HISTORY — DX: Depression, unspecified: F32.A

## 2020-08-15 HISTORY — DX: Encounter for screening for human immunodeficiency virus (HIV): Z11.4

## 2020-08-15 NOTE — Progress Notes (Signed)
Pt received his AVS, questions answered and escorted to retrieve his personal belongongs.
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
## 2020-08-15 NOTE — ED Provider Notes (Signed)
Behavioral Health Urgent Care Medical Screening Exam  Patient Name: Darren Wells MRN: [id number] Date of Evaluation: 08/15/20 Chief Complaint:   Diagnosis:  Final diagnoses:  Adjustment disorder with mixed anxiety and depressed mood    History of Present illness: Darren Wells is a 60 y.o. male patient presented to [facility] as a walk in accompanied by his case worker requesting evaluation for therapy.    Darren Wells, 60 y.o., male patient seen face to face by this provider, consulted with Dr. Serafina Mitchell; and chart reviewed on 08/15/20.  On evaluation Darren Wells reports he was told to come "here" for an evaluation for therapy.  Report history of depression and anxiety.  States he has had outpatient psychiatric services before; last was about 2 yrs ago.  Denies prior psychiatric hospitalization.  Patient states he only wants to get set up for therapy sessions.   During evaluation [name] is sitting up right in chair in no acute distress.  He is alert, oriented x 4, calm and cooperative.  His mood is euthymic with congruent affect.  He does not appear to be responding to internal/external stimuli or delusional thoughts.  Patient denies suicidal/self-harm/homicidal ideation, psychosis, and paranoia.  Patient answered question appropriately.  Spoke with receptionist of [facility] and was given an appointment for 2:00 PM today for therapy intake assessment.    Psychiatric Specialty Exam  Presentation  General Appearance:Appropriate for Environment; Casual; Disheveled  Eye Contact:Good  Speech:Clear and Coherent; Normal Rate  Speech Volume:Normal  Handedness:Right   Mood and Affect  Mood:Euthymic  Affect:Appropriate   Thought Process  Thought Processes:Coherent; Goal Directed  Descriptions of Associations:Intact  Orientation:Full (Time, Place and Person)  Thought Content:WDL  Hallucinations:None  Ideas of Reference:None  Suicidal Thoughts:No  Homicidal  Thoughts:No   Sensorium  Memory:Immediate Good; Recent Good; Remote Good  Judgment:No data recorded Insight:Present   Executive Functions  Concentration:Good  Attention Span:Good  [name]  Language:Good   Psychomotor Activity  Psychomotor Activity:Normal   Assets  Assets:Communication Skills; Desire for Improvement; Housing; Social Support   Sleep  Sleep:Good  Number of hours: No data recorded  Physical Exam: Physical Exam Vitals and nursing note reviewed. Exam conducted with a chaperone present.  Constitutional:      General: He is not in acute distress.    Appearance: Normal appearance. He is not ill-appearing.  HENT:     Head: Normocephalic.  Eyes:     Pupils: Pupils are equal, round, and reactive to light.  Cardiovascular:     Rate and Rhythm: Normal rate.  Pulmonary:     Effort: Pulmonary effort is normal.  Musculoskeletal:        General: Normal range of motion.     Cervical back: Normal range of motion.  Skin:    General: Skin is warm and dry.  Neurological:     Mental Status: He is alert and oriented to person, place, and time.  Psychiatric:        Attention and Perception: Attention and perception normal. He does not perceive auditory or visual hallucinations.        Mood and Affect: Mood and affect normal.        Speech: Speech normal.        Behavior: Behavior normal. Behavior is cooperative.        Thought Content: Thought content normal. Thought content is not paranoid or delusional. Thought content does not include homicidal or suicidal ideation.  Cognition and Memory: Cognition and memory normal.        Judgment: Judgment normal.    Review of Systems  Constitutional: Negative.   HENT: Negative.   Eyes: Negative.   Respiratory: Negative.   Cardiovascular: Negative.   Gastrointestinal: Negative.   Genitourinary: Negative.   Musculoskeletal: Negative.   Skin: Negative.   Neurological: Negative.    Endo/Heme/Allergies: Negative.   Psychiatric/Behavioral: Negative for memory loss. Depression: Stable. Hallucinations: Denies. Substance abuse: Denies. Suicidal ideas: Denies. The patient does not have insomnia. Nervous/anxious: Stable.    Blood pressure 115/69, pulse 69, temperature 97.8 F (36.6 C), temperature source Oral, resp. rate 18, height 6\' 5"  (1.956 m), SpO2 98 %. Body mass index is 23.12 kg/m.  Musculoskeletal: Strength & Muscle Tone: within normal limits Gait & Station: normal Patient leans: N/A   Level Green MSE Discharge Disposition for Follow up and Recommendations: Based on my evaluation the patient does not appear to have an emergency medical condition and can be discharged with resources and follow up care in outpatient services for [place] Today.   Specialty: Urgent Care Why: At 2:00 PM for therapy intake  ([facility]) Contact information: [place] Ogle, NP 08/15/2020, 9:56 AM

## 2020-08-15 NOTE — ED Triage Notes (Signed)
Pt presents to [facility] as a walk-in requesting assessment for therapy. Pt denies SI, HI and AVH at this time.

## 2020-08-18 DIAGNOSIS — F324 Major depressive disorder, single episode, in partial remission: Secondary | ICD-10-CM | POA: Insufficient documentation
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
## 2020-10-01 ENCOUNTER — Encounter (HOSPITAL_COMMUNITY): Payer: Self-pay | Admitting: Psychiatry

## 2020-10-01 ENCOUNTER — Other Ambulatory Visit: Payer: Self-pay

## 2020-10-01 ENCOUNTER — Ambulatory Visit (INDEPENDENT_AMBULATORY_CARE_PROVIDER_SITE_OTHER): Payer: No Payment, Other | Admitting: Psychiatry

## 2020-10-01 VITALS — BP 127/70 | HR 73 | Ht 77.0 in | Wt 174.8 lb

## 2020-10-01 DIAGNOSIS — Z008 Encounter for other general examination: Secondary | ICD-10-CM | POA: Diagnosis not present

## 2020-10-01 NOTE — Progress Notes (Signed)
Psychiatric Initial Adult Assessment   Patient Identification: Darren Wells MRN:  [id number] Date of Evaluation:  10/01/2020   Referral Source: Self  Chief Complaint:  " I am trying to get disability."  Visit Diagnosis:    ICD-10-CM   1. Normal psychiatric assessment  Z00.8     History of Present Illness: 60 year old male with no prior psychiatry history now seen for psychiatric evaluation.  He was seen once at [facility] behavioral health urgent care center on August 15, 2020.  He was referred to the outpatient therapy in this clinic on the same day.  He was evaluated by the therapist and based on their assessment he did not meet criteria for anxiety or depression or any other psychiatric illness. Today, he presented with his caseworker from [employer] was sitting outside in the lobby. When writer asked if patient needed any help he replied, " No, not really." When writer asked him about his visit to the urgent care center on February 2, he replied that he is trying to get on disability.  He stated that he is too old to work. He denied having any medical conditions.  Writer reviewed the EMR as per which patient has history of reflux for which she was taking medication in the past.  He underwent a routine colonoscopy in September 2020.  He has history of bilateral knee osteoarthritis.  Writer asked him any screening questions to rule out depression, anxiety, mania, hypomania, PTSD, psychosis.  Patient denied any symptoms indicative of any of these conditions. He reported that he sleeps well.  He currently lives in a assisted housing facility provided by [employer].  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  He stated that he does not get paid for any of the work he does. He stated that he gets all his basic groceries through them.  His utilities are paid for by the [employer] and he is not responsible for any bills at present. He  denied excessive consumption of alcohol.  He denied using any illicit substances.   Writer informed the patient that unfortunately there was nothing the writer could help him with regarding his disability.  He was informed that he can go to [employer].  He was asked if he is interested in seeing therapist again and he declined that politely.  After seeing the patient, writer spoke with the therapist in the clinic who evaluated him on February 2.  The therapist Ms. Paige informed that she did not find any significant psychiatric symptoms in the patient when she did her evaluation.  She did however recall that when the patient left her office and went to the lobby the front desk contacted her to state that he needed an excuse note stating that he had an appointment today and due to that he was not able to keep the court date.  Therapist is not aware of the reason that he had a court date that day.  Past Psychiatric History: Was seen once at [facility] behavioral health urgent care center on February 2 and was referred to outpatient therapy in this clinic on the same day.  Previous Psychotropic Medications: No   Substance Abuse History in the last 12 months:  No.  Consequences of Substance Abuse: NA  Past Medical History:  Past Medical History:  Diagnosis Date  . Arthritis   . Depression   . GERD (gastroesophageal reflux disease)   . Hyperlipidemia   . Screening  for HIV (human immunodeficiency virus)     Past Surgical History:  Procedure Laterality Date  . BACK SURGERY  2004    Family Psychiatric History: Denied any history of psychiatric illness  Family History:  Family History  Problem Relation Age of Onset  . Colon cancer Neg Hx   . Esophageal cancer Neg Hx   . Rectal cancer Neg Hx   . Stomach cancer Neg Hx   . Colon polyps Neg Hx     Social History:   Social History   Socioeconomic History  . Marital status: Divorced    Spouse name: Not on file  .  Number of children: 0  . Years of education: Not on file  . Highest education level: Not on file  Occupational History  . Occupation: unemployed  Tobacco Use  . Smoking status: Current Every Day Smoker    Packs/day: 0.50    Years: 30.00    Pack years: 15.00    Types: Cigarettes, Cigars  . Smokeless tobacco: Never Used  Vaping Use  . Vaping Use: Never used  Substance and Sexual Activity  . Alcohol use: Yes    Alcohol/week: 2.0 standard drinks    Types: 2 Cans of beer per week    Comment: once  . Drug use: No  . Sexual activity: Not Currently  Other Topics Concern  . Not on file  Social History Narrative  . Not on file   Social Determinants of Health   Financial Resource Strain: Not on file  Food Insecurity: Not on file  Transportation Needs: Not on file  Physical Activity: Not on file  Stress: Not on file  Social Connections: Not on file    Additional Social History: Lives alone, lives in assisted housing provided by [employer].  Is single and does volunteer work for [employer].  Allergies:  No Known Allergies  Metabolic Disorder Labs: Lab Results  Component Value Date   HGBA1C 5.3 02/09/2019   No results found for: PROLACTIN Lab Results  Component Value Date   CHOL 227 (H) 02/09/2019   TRIG 181 (H) 02/09/2019   HDL 36 (L) 02/09/2019   CHOLHDL 6.3 (H) 02/09/2019   LDLCALC 155 (H) 02/09/2019   No results found for: TSH  Therapeutic Level Labs: No results found for: LITHIUM No results found for: CBMZ No results found for: VALPROATE  Current Medications: Current Outpatient Medications  Medication Sig Dispense Refill  . atorvastatin (LIPITOR) 20 MG tablet Take 1 tablet (20 mg total) by mouth daily at 6 PM. 90 tablet 3  . famotidine (PEPCID) 20 MG tablet Take 1 tablet (20 mg total) by mouth 2 (two) times daily. 60 tablet 2   Current Facility-Administered Medications  Medication Dose Route Frequency Provider Last Rate Last Admin  .  0.9 %  sodium chloride infusion  500 mL Intravenous Once Mansouraty, Telford Nab., MD      . ibuprofen (ADVIL) tablet 400 mg  400 mg Oral Once Mansouraty, Telford Nab., MD        Musculoskeletal: Strength & Muscle Tone: within normal limits Gait & Station: normal Patient leans: N/A  Psychiatric Specialty Exam: Review of Systems  Blood pressure 127/70, pulse 73, height 6\' 5"  (1.956 m), weight 174 lb 12.8 oz (79.3 kg), SpO2 98 %.Body mass index is 20.73 kg/m.  General Appearance: Fairly Groomed, soft-spoken, pleasant  Eye Contact:  Good  Speech:  Clear and Coherent and Normal Rate  Volume:  Normal  Mood:  Euthymic  Affect:  Congruent  Thought Process:  Goal Directed and Descriptions of Associations: Intact  Orientation:  Full (Time, Place, and Person)  Thought Content:  Logical and Denied any hallucinations or delusions  Suicidal Thoughts:  No  Homicidal Thoughts:  No  Memory:  Immediate;   Good Recent;   Good  Judgement:  Fair  Insight:  Fair  Psychomotor Activity:  Normal  Concentration:  Concentration: Good and Attention Span: Good  Recall:  Good  Fund of Knowledge:Good  Language: Good  Akathisia:  Negative  Handed:  Right  AIMS (if indicated): Not indicated  Assets:  Communication Skills Desire for Improvement Housing Social Support Talents/Skills  ADL's:  Intact  Cognition: WNL  Sleep:  Good   Screenings: GAD-7   Flowsheet Row Office Visit from 10/01/2020 in [facility] Counselor from 08/15/2020 in [facility]  Total GAD-7 Score 0 1    PHQ2-9   [place] Office Visit from 10/01/2020 in [facility] Most recent reading at 10/01/2020  1:29 PM Counselor from 08/15/2020 in [facility] Most recent reading at 08/15/2020  2:14 PM ED from 08/15/2020 in [facility] Most recent reading at 08/15/2020  8:06 AM Office Visit from 02/09/2019 in  [place] Most recent reading at 02/09/2019  8:50 AM  PHQ-2 Total Score 0 0 1 0  PHQ-9 Total Score 0 0 - -    [place] Visit from 10/01/2020 in [facility] ED from 08/15/2020 in [place] No Risk No Risk      Assessment and Plan: 60 year old male with no prior psychiatry history now presenting for psychiatric assessment.  Based on his evaluation he does not meet criteria for any psychiatric illness.  1. Normal psychiatric assessment  No further follow-up needed with psychiatry.     [place] Crane, MD 3/21/20221:29 PM

## 2021-02-13 ENCOUNTER — Encounter: Payer: Self-pay | Admitting: Family Medicine

## 2021-02-13 ENCOUNTER — Ambulatory Visit (INDEPENDENT_AMBULATORY_CARE_PROVIDER_SITE_OTHER): Payer: Self-pay | Admitting: Family Medicine

## 2021-02-13 ENCOUNTER — Other Ambulatory Visit: Payer: Self-pay

## 2021-02-13 VITALS — BP 89/60 | HR 87 | Ht 77.0 in | Wt 167.4 lb

## 2021-02-13 DIAGNOSIS — R7989 Other specified abnormal findings of blood chemistry: Secondary | ICD-10-CM

## 2021-02-13 DIAGNOSIS — E785 Hyperlipidemia, unspecified: Secondary | ICD-10-CM | POA: Insufficient documentation

## 2021-02-13 DIAGNOSIS — Z122 Encounter for screening for malignant neoplasm of respiratory organs: Secondary | ICD-10-CM

## 2021-02-13 DIAGNOSIS — Z7689 Persons encountering health services in other specified circumstances: Secondary | ICD-10-CM

## 2021-02-13 MED ORDER — FAMOTIDINE 20 MG PO TABS: 20.0000 mg | ORAL_TABLET | Freq: Two times a day (BID) | ORAL | 2 refills | Status: DC

## 2021-02-13 NOTE — Patient Instructions (Addendum)
It was great seeing you today.  Regarding the issues with your drinking water and throwing puncture to try some Pepcid.  I would like to see you in a month for follow-up 1 month to make sure it is improving.  We can also check your weight at that time.  Regarding your other routine health maintenance I am collecting some lab work today and we have ordered imaging of your chest.  I will call you with the results and if there are any changes that need to be made to medications or things like that.  If you have any questions or concerns please call the clinic.  I hope you have a wonderful afternoon!

## 2021-02-13 NOTE — Assessment & Plan Note (Signed)
Patient presents to establish care with a new provider.  No major concerns at this time other than his abdominal pain which he reports when he drinks excess water he will feel pain in his throat and then vomit.  Has not taken any medications for this although Pepcid is on his medication list.  Reorder Pepcid today.  Also patient had cancer screening CT in 2020 which recommended follow-up in 1 year.  Repeat screening CT ordered.  Patient has orange card and no insurance so they are going to work on patient assistance before he gets this screen.  Patient also had elevated creatinine at previous check so we will check BMP today.  Follow-up in 1 month.

## 2021-02-13 NOTE — Progress Notes (Signed)
    SUBJECTIVE:   CHIEF COMPLAINT / HPI:   New patient visit  Current concerns Poor appetite with reflux symptoms   PMH Bipolar disorder - 2018? By [facility] behavioral health team  Arthritis 2000, hands, knees  Past surgical history Back surgery in 2004 for ruptured/bulging disc Hand surgery when 60 yo after finger crush   Allergies No known drug allergies  Family history Unknown  Social history Lives in [place]. 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 Has a case worker to help organize things. Cousins that live around. Drinks occasionally, case of beer will last a month. Smokes cigarrettes 10-12 per day. Has been smoking for 37 years. No other drugs.    OBJECTIVE:   BP (!) 89/60   Pulse 87   Ht '6\' 5"'$  (1.956 m)   Wt 167 lb 6.4 oz (75.9 kg)   SpO2 96%   BMI 19.85 kg/m   General: Well-appearing 60 year old male, no acute distress Cardiac: Regular rate and rhythm, no murmurs appreciated Respiratory: Normal work of breathing, lungs clear to auscultation bilaterally Abdomen: Soft, nontender, positive bowel sounds MSK: No gross abnormalities Neuro exam: No gross abnormalities, cranial nerves intact  ASSESSMENT/PLAN:   Hyperlipidemia Patient with elevated cholesterol 08/02/2018.  Has not had repeat lipid panel since that time.  Collecting lipid panel today and will determine need for statin after these results come back.  We will call patient with results.  Discussed with case worker.  Encounter to establish care Patient presents to establish care with a new provider.  No major concerns at this time other than his abdominal pain which he reports when he drinks excess water he will feel pain in his throat and then vomit.  Has not taken any medications for this although Pepcid is on his medication list.  Reorder Pepcid today.  Also patient had cancer screening CT in 2020 which recommended follow-up in 1 year.  Repeat screening CT  ordered.  Patient has orange card and no insurance so they are going to work on patient assistance before he gets this screen.  Patient also had elevated creatinine at previous check so we will check BMP today.  Follow-up in 1 month.     Gifford Shave, MD [place]

## 2021-02-13 NOTE — Assessment & Plan Note (Signed)
Patient with elevated cholesterol 08/02/2018.  Has not had repeat lipid panel since that time.  Collecting lipid panel today and will determine need for statin after these results come back.  We will call patient with results.  Discussed with case worker.

## 2021-02-14 LAB — BASIC METABOLIC PANEL
BUN/Creatinine Ratio: 7 — ABNORMAL LOW (ref 9–20)
BUN: 13 mg/dL (ref 6–24)
CO2: 28 mmol/L (ref 20–29)
Calcium: 8.8 mg/dL (ref 8.7–10.2)
Chloride: 104 mmol/L (ref 96–106)
Creatinine, Ser: 1.94 mg/dL — ABNORMAL HIGH (ref 0.76–1.27)
Glucose: 91 mg/dL (ref 65–99)
Potassium: 5.1 mmol/L (ref 3.5–5.2)
Sodium: 144 mmol/L (ref 134–144)
eGFR: 39 mL/min/{1.73_m2} — ABNORMAL LOW (ref >59)

## 2021-02-14 LAB — LIPID PANEL
Chol/HDL Ratio: 4.2 ratio (ref 0.0–5.0)
Cholesterol, Total: 148 mg/dL (ref 100–199)
HDL: 35 mg/dL — ABNORMAL LOW (ref >39)
LDL Chol Calc (NIH): 93 mg/dL (ref 0–99)
Triglycerides: 107 mg/dL (ref 0–149)
VLDL Cholesterol Cal: 20 mg/dL (ref 5–40)

## 2021-02-18 ENCOUNTER — Telehealth: Payer: Self-pay | Admitting: Family Medicine

## 2021-02-18 DIAGNOSIS — R7989 Other specified abnormal findings of blood chemistry: Secondary | ICD-10-CM

## 2021-02-18 NOTE — Telephone Encounter (Signed)
Called patient to discuss his elevated creatinine on his recent BMP from Dr. Caron Presume.  Patient denies taking any ibuprofen, naproxen, or other over-the-counter pain medications which may have caused a bump in creatinine.  He is not currently on any medications that should cause this.  Patient does not know if he was dehydrated or not during that visit.  He has follow-up scheduled with Dr. Leonarda Salon on September 7.  I discussed with the patient that I recommend he come in in the next 1 to 2 weeks for a recheck of his BMP to see if his creatinine is increasing, decreasing, or is stable compared to the previous.  Patient is in agreement with this.    We will place lab order for future BMP and have scheduled patient for a lab visit on 8/15 at 11:30 AM.  Patient understands he does not need to see a provider at this visit.

## 2021-02-25 ENCOUNTER — Other Ambulatory Visit: Payer: Self-pay

## 2021-02-25 ENCOUNTER — Other Ambulatory Visit: Payer: Medicaid Other

## 2021-02-25 DIAGNOSIS — R7989 Other specified abnormal findings of blood chemistry: Secondary | ICD-10-CM

## 2021-02-26 LAB — BASIC METABOLIC PANEL
BUN/Creatinine Ratio: 5 — ABNORMAL LOW (ref 9–20)
BUN: 8 mg/dL (ref 6–24)
CO2: 24 mmol/L (ref 20–29)
Calcium: 8.7 mg/dL (ref 8.7–10.2)
Chloride: 107 mmol/L — ABNORMAL HIGH (ref 96–106)
Creatinine, Ser: 1.49 mg/dL — ABNORMAL HIGH (ref 0.76–1.27)
Glucose: 93 mg/dL (ref 65–99)
Potassium: 4.2 mmol/L (ref 3.5–5.2)
Sodium: 142 mmol/L (ref 134–144)
eGFR: 54 mL/min/{1.73_m2} — ABNORMAL LOW (ref >59)

## 2021-03-20 ENCOUNTER — Other Ambulatory Visit: Payer: Self-pay

## 2021-03-20 ENCOUNTER — Ambulatory Visit (INDEPENDENT_AMBULATORY_CARE_PROVIDER_SITE_OTHER): Payer: Self-pay | Admitting: Family Medicine

## 2021-03-20 ENCOUNTER — Encounter: Payer: Self-pay | Admitting: Family Medicine

## 2021-03-20 VITALS — BP 100/62 | Ht 77.0 in | Wt 175.0 lb

## 2021-03-20 DIAGNOSIS — N183 Chronic kidney disease, stage 3 unspecified: Secondary | ICD-10-CM

## 2021-03-20 DIAGNOSIS — R7989 Other specified abnormal findings of blood chemistry: Secondary | ICD-10-CM

## 2021-03-20 DIAGNOSIS — F17200 Nicotine dependence, unspecified, uncomplicated: Secondary | ICD-10-CM

## 2021-03-20 DIAGNOSIS — IMO0002 Reserved for concepts with insufficient information to code with codable children: Secondary | ICD-10-CM

## 2021-03-20 DIAGNOSIS — L84 Corns and callosities: Secondary | ICD-10-CM

## 2021-03-20 LAB — POCT URINALYSIS DIP (MANUAL ENTRY)
Blood, UA: NEGATIVE
Glucose, UA: NEGATIVE mg/dL
Leukocytes, UA: NEGATIVE
Nitrite, UA: NEGATIVE
Protein Ur, POC: 30 mg/dL — AB
Spec Grav, UA: >=1.03 — AB (ref 1.010–1.025)
Urobilinogen, UA: 1 E.U./dL
pH, UA: 6 (ref 5.0–8.0)

## 2021-03-20 MED ORDER — NICOTINE 7 MG/24HR TD PT24: 7.0000 mg | MEDICATED_PATCH | Freq: Every day | TRANSDERMAL | 0 refills | Status: DC

## 2021-03-20 NOTE — Progress Notes (Deleted)
    SUBJECTIVE:   CHIEF COMPLAINT / HPI:   ***  PERTINENT  PMH / PSH: ***  OBJECTIVE:   BP 100/62   Ht '6\' 5"'$  (1.956 m)   Wt 175 lb (79.4 kg)   BMI 20.75 kg/m   ***  ASSESSMENT/PLAN:   No problem-specific Assessment & Plan notes found for this encounter.     Gifford Shave, MD [place]

## 2021-03-20 NOTE — Progress Notes (Signed)
    SUBJECTIVE:   CHIEF COMPLAINT / HPI:   Foot issue Patient reports he has a callus on his right foot which she has been using a razor to shave.  It is extremely painful when he walks on it and works on it.  He wears an insole in his shoe but this still occurs.Denies any acute injury to the foot.  Smoking cessation Patient is currently smoking around 10 cigarettes a day.  He is interested in quitting and would like assistance with this.  We discussed Chantix versus NicoDerm patch and he would rather do the patch.  Kidney concerns Patient has had elevated creatinine in the past.  Blood work in August showed creatinine of 1.94 which a recheck went down to 1.40 after adequate hydration.  His baseline appears to be around 1.40 meaning he has CKD stage IIIa.  Patient does not have hypertension, diabetes, obesity, known drug use although he does smoke tobacco products.  Unclear etiology of CKD.  Denies any difficulties urinating.  OBJECTIVE:   BP 100/62   Ht '6\' 5"'$  (1.956 m)   Wt 175 lb (79.4 kg)   BMI 20.75 kg/m   General: Well-appearing 60 year old male, no acute distress Cardiac: Regular rate and rhythm, no murmurs appreciated Respiratory: Normal for breathing, lungs clear to auscultation bilaterally MSK: Callus noted on lateral aspect of right foot     ASSESSMENT/PLAN:   CKD (chronic kidney disease) Unclear etiology of patient's CKD.  Patient does not have hypertension or diabetes.  Will obtain urinalysis and consider further work-up versus referral to nephrology.  Encouraged adequate hydration the patient agrees to do this.  Foot callus Patient has recurrent callus on the lateral aspect of right foot.  Requesting further management of this.  Referral placed for podiatry to evaluate for further treatments.  Nicotine use disorder Encouraged smoking cessation today.  Patient is interested in quitting and prescribed NicoDerm patch.  Patient does need low-dose CT for lung cancer  screening.     Gifford Shave, MD [place]

## 2021-03-20 NOTE — Patient Instructions (Signed)
It was great seeing you today!  I want to get a urine sample from you today to make sure that looks okay.  I want you to make sure to drink plenty of water and avoid any things that may harm your kidneys.  The main thing that harms your kidneys are NSAIDs such as ibuprofen as well as not drinking enough fluid.  Regarding your foot have sent a referral in for podiatry.  We also called and scheduled your CT to look at your lungs.  If you have any questions or concerns call the clinic.  I hope you have a wonderful afternoon!

## 2021-03-21 LAB — URINALYSIS, MICROSCOPIC ONLY: Bacteria, UA: NONE SEEN

## 2021-03-22 ENCOUNTER — Encounter: Payer: Self-pay | Admitting: Family Medicine

## 2021-03-22 DIAGNOSIS — R7989 Other specified abnormal findings of blood chemistry: Secondary | ICD-10-CM | POA: Insufficient documentation

## 2021-03-22 DIAGNOSIS — L84 Corns and callosities: Secondary | ICD-10-CM | POA: Insufficient documentation

## 2021-03-22 DIAGNOSIS — N189 Chronic kidney disease, unspecified: Secondary | ICD-10-CM

## 2021-03-22 HISTORY — DX: Chronic kidney disease, unspecified: N18.9

## 2021-03-22 NOTE — Assessment & Plan Note (Signed)
Patient has recurrent callus on the lateral aspect of right foot.  Requesting further management of this.  Referral placed for podiatry to evaluate for further treatments.

## 2021-03-22 NOTE — Assessment & Plan Note (Signed)
Encouraged smoking cessation today.  Patient is interested in quitting and prescribed NicoDerm patch.  Patient does need low-dose CT for lung cancer screening.

## 2021-03-22 NOTE — Assessment & Plan Note (Signed)
Unclear etiology of patient's CKD.  Patient does not have hypertension or diabetes.  Will obtain urinalysis and consider further work-up versus referral to nephrology.  Encouraged adequate hydration the patient agrees to do this.

## 2021-03-28 ENCOUNTER — Ambulatory Visit (HOSPITAL_COMMUNITY)
Admission: RE | Admit: 2021-03-28 | Discharge: 2021-03-28 | Disposition: A | Discharge status: Home / Self Care | Payer: Self-pay | Source: Ambulatory Visit | Attending: Family Medicine | Admitting: Family Medicine

## 2021-03-28 ENCOUNTER — Other Ambulatory Visit: Payer: Self-pay

## 2021-03-28 DIAGNOSIS — Z122 Encounter for screening for malignant neoplasm of respiratory organs: Secondary | ICD-10-CM | POA: Insufficient documentation

## 2021-04-08 ENCOUNTER — Telehealth: Payer: Self-pay | Admitting: *Deleted

## 2021-04-08 DIAGNOSIS — N183 Chronic kidney disease, stage 3 unspecified: Secondary | ICD-10-CM

## 2021-04-08 NOTE — Telephone Encounter (Signed)
Terrence Dupont called on behalf of patient requesting a referral to [facility].  She said that she spoke with [facility] and they will take his referral and process it with his orange card.  Will forward to MD to place this referral.  Darren Wells

## 2021-04-09 NOTE — Telephone Encounter (Signed)
Referral placed.

## 2021-04-10 ENCOUNTER — Encounter: Payer: Self-pay | Admitting: Podiatry

## 2021-04-10 ENCOUNTER — Other Ambulatory Visit: Payer: Self-pay

## 2021-04-10 ENCOUNTER — Ambulatory Visit (INDEPENDENT_AMBULATORY_CARE_PROVIDER_SITE_OTHER): Payer: No Typology Code available for payment source | Admitting: Podiatry

## 2021-04-10 DIAGNOSIS — L84 Corns and callosities: Secondary | ICD-10-CM

## 2021-04-10 DIAGNOSIS — R52 Pain, unspecified: Secondary | ICD-10-CM

## 2021-04-10 DIAGNOSIS — M7741 Metatarsalgia, right foot: Secondary | ICD-10-CM

## 2021-04-10 NOTE — Progress Notes (Signed)
  Subjective:  Patient ID: Darren Wells, male    DOB: 11-05-1960,  MRN: [id number]  Painful hard skin under the fifth toe  60 y.o. male presents with the above complaint. History confirmed with patient.   Objective:  Physical Exam: warm, good capillary refill, no trophic changes or ulcerative lesions, normal DP and PT pulses, and normal sensory exam. Left Foot: normal exam, no swelling, tenderness, instability; ligaments intact, full range of motion of all ankle/foot joints Right Foot: Submetatarsal 5 hyperkeratosis painful to touch  Assessment:   1. Callus of foot   2. Metatarsalgia of right foot   3. Pain      Plan:  Patient was evaluated and treated and all questions answered.  All symptomatic hyperkeratoses were safely debrided with a sterile #15 blade to patient's level of comfort without incident. We discussed preventative and palliative care of these lesions including supportive and accommodative shoegear, padding, prefabricated and custom molded accommodative orthoses, use of a pumice stone and lotions/creams daily.  Dispensed a dancers pad for his work shoes and gave another 1 for his other boots he should offload the lesion appropriately.  Recommended daily use of urea cream and pumice stone   Return if symptoms worsen or fail to improve.

## 2021-07-20 IMAGING — CT CT CHEST LUNG CANCER SCREENING LOW DOSE
2 of 5 series · 15 of 40 positions shown, 18 images · non-contrast
Comparison: None.

CLINICAL DATA: 58-year-old asymptomatic male current smoker with 35
pack-year smoking history.

EXAM:
CT CHEST WITHOUT CONTRAST LOW-DOSE FOR LUNG CANCER SCREENING
TECHNIQUE: Multidetector CT imaging of the chest was performed following the
standard protocol without IV contrast.

[Series 4: lung 1.00 br44 cor · coronal · 0.73mm/px · 3 of 367 slices shown]
[im 74/367  lung]
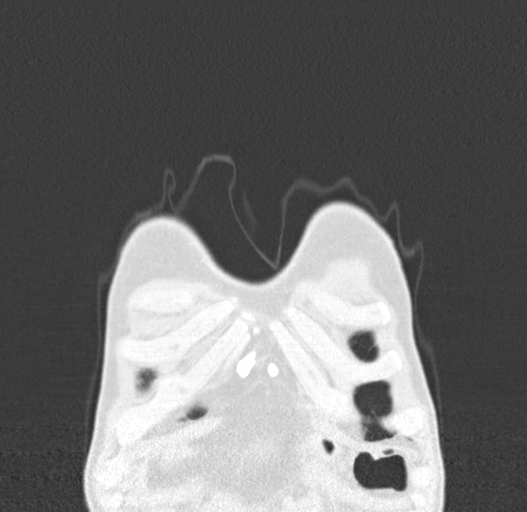
[im 147/367  lung]
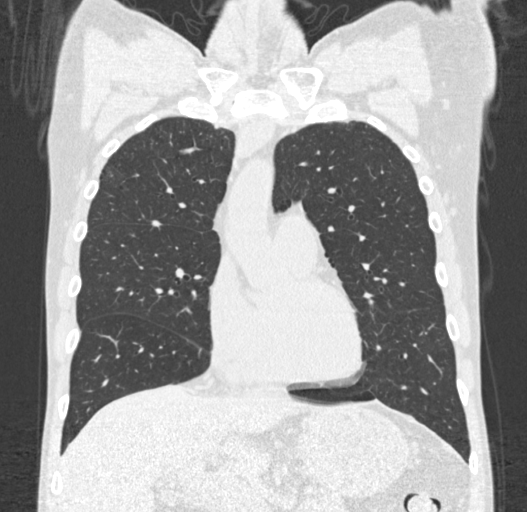
[im 220/367  lung]
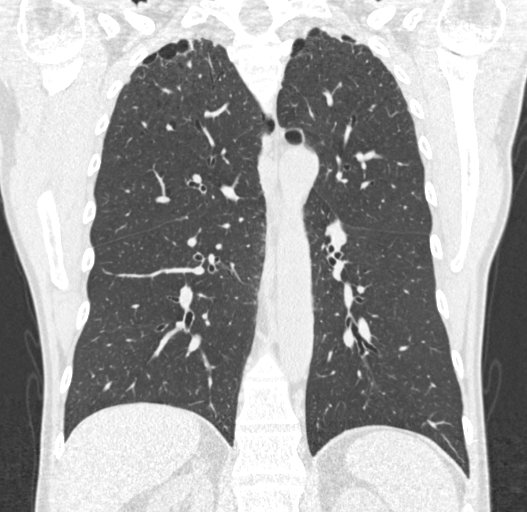

[Series 9: lung 1.00 br60 axial · axial · 0.75mm/px · z∈[-1179,-844]mm · 12 of 371 slices shown, 15 images]
[im 18/371  mediastinal]
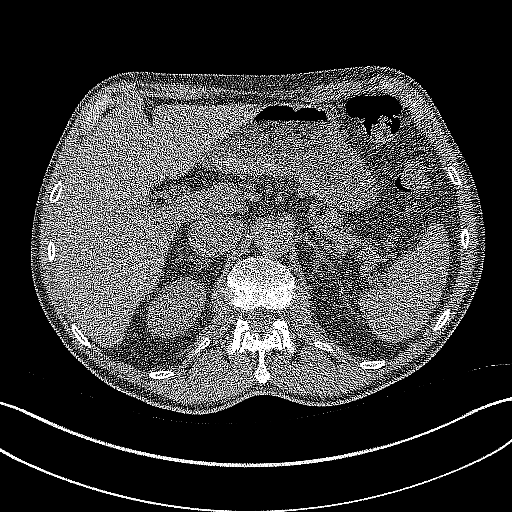
[im 18/371  lung]
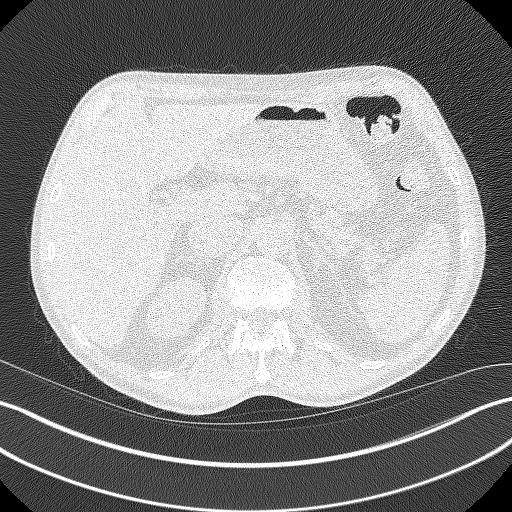
[im 53/371  lung]
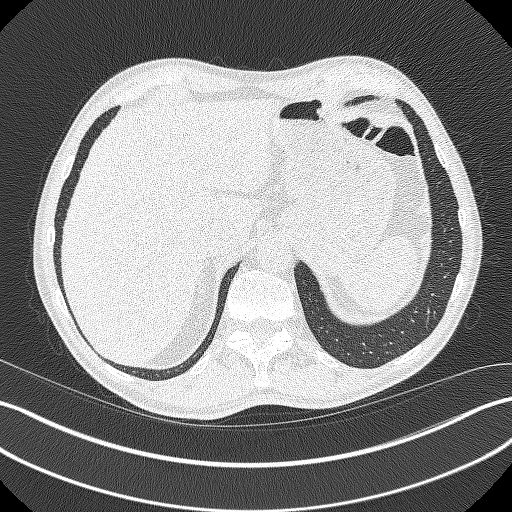
[im 89/371  lung]
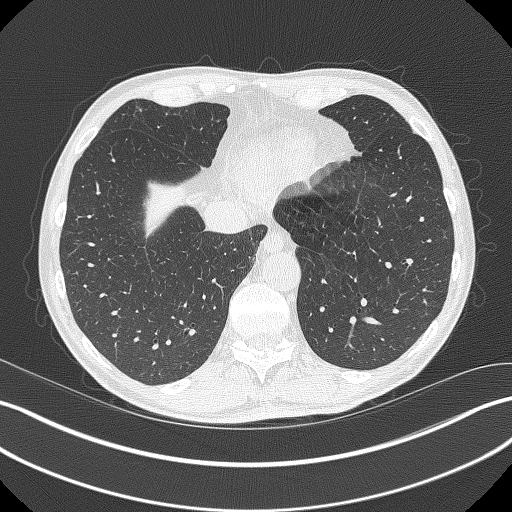
[im 106/371  lung]
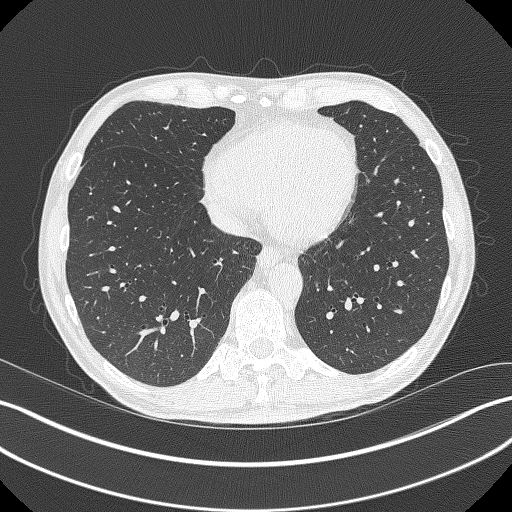
[im 141/371  mediastinal]
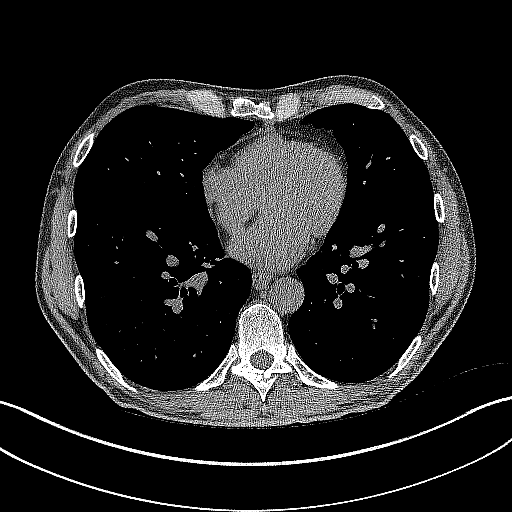
[im 141/371  lung]
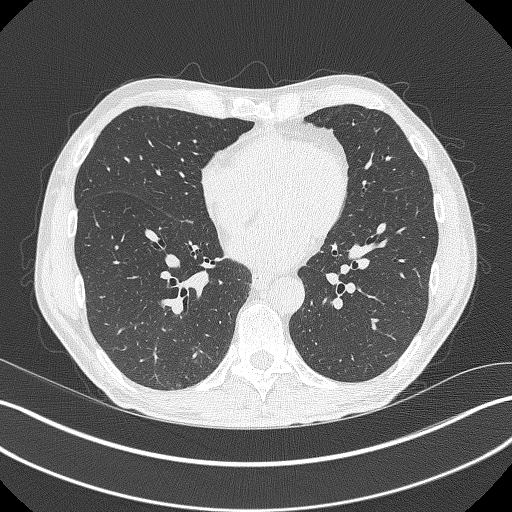
[im 177/371  lung]
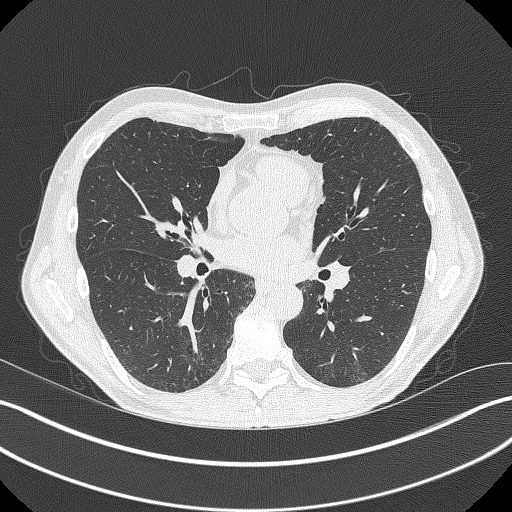
[im 194/371  lung]
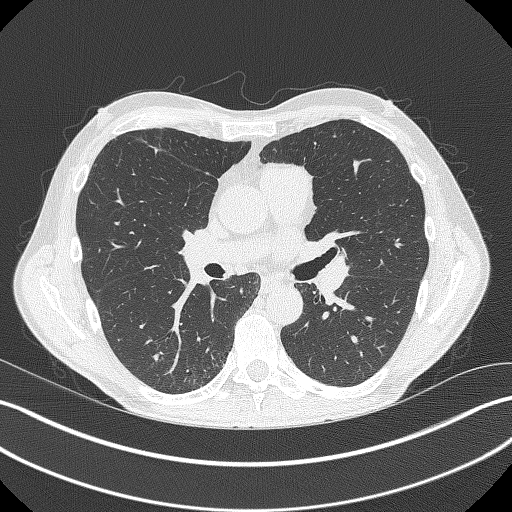
[im 230/371  lung]
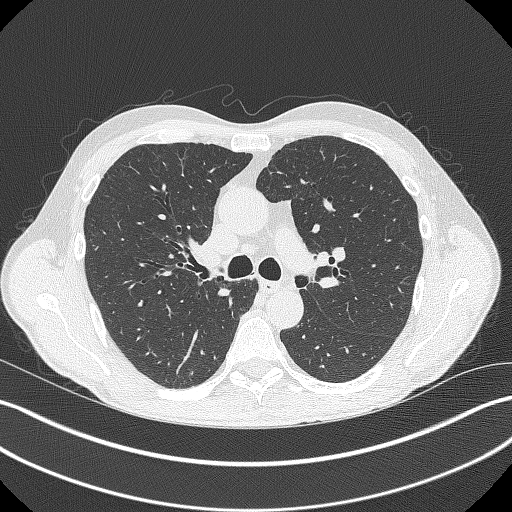
[im 265/371  mediastinal]
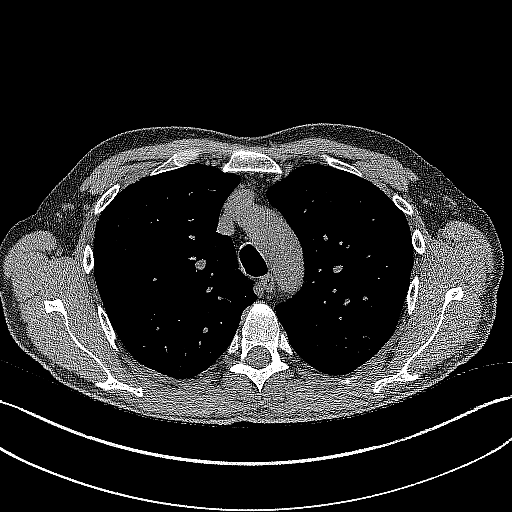
[im 265/371  lung]
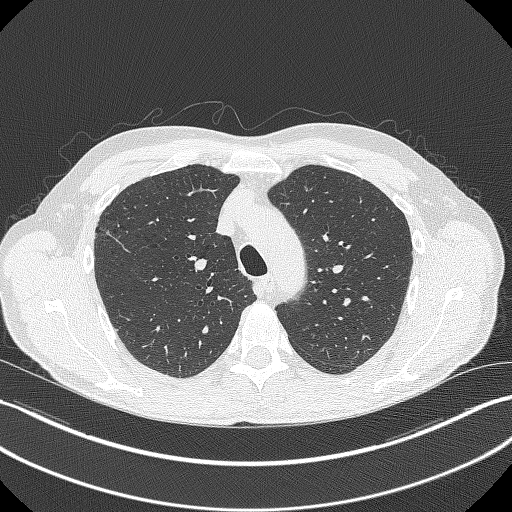
[im 282/371  lung]
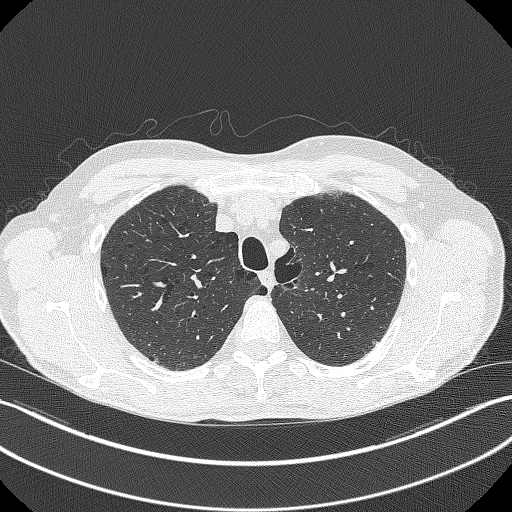
[im 318/371  lung]
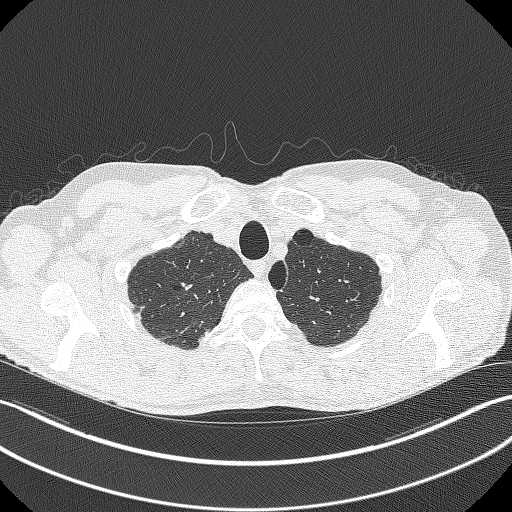
[im 353/371  lung]
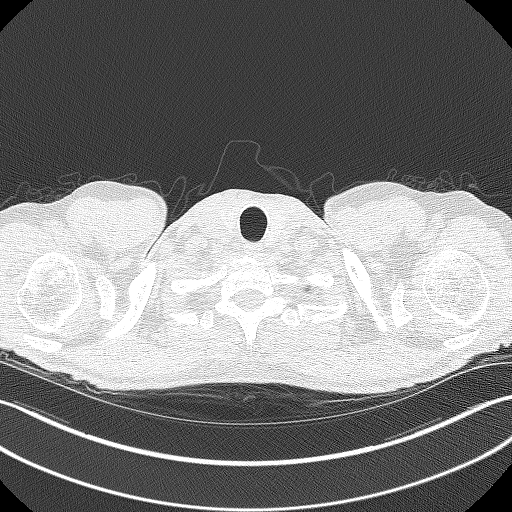

[15 of 40 positions shown; findings below may reference images not displayed]

FINDINGS: Cardiovascular: Normal heart size. No significant pericardial
effusion/thickening. Great vessels are normal in course and caliber.

Mediastinum/Nodes: No discrete thyroid nodules. Unremarkable
esophagus. No pathologically enlarged axillary, mediastinal or hilar
lymph nodes, noting limited sensitivity for the detection of hilar
adenopathy on this noncontrast study.

Lungs/Pleura: No pneumothorax. No pleural effusion. Mild
centrilobular and paraseptal emphysema. No acute consolidative
airspace disease or lung masses. A few scattered solid pulmonary
nodules in both lungs, largest 5.5 mm in volume derived mean
diameter in the apical right upper lobe (series 9/image 64).

Upper abdomen: Simple exophytic 1.9 cm upper right renal cyst.

Musculoskeletal: No aggressive appearing focal osseous lesions. Mild
thoracic spondylosis.
IMPRESSION: Lung-RADS 2, benign appearance or behavior. Continue annual
screening with low-dose chest CT without contrast in 12 months.

Emphysema ([id number]).

## 2021-07-31 ENCOUNTER — Telehealth: Payer: Self-pay

## 2021-07-31 NOTE — Telephone Encounter (Signed)
Patient's case manager, Terrence Dupont calls nurse line regarding patient. DPR signed in chart allowing [place] to speak with Terrence Dupont.   Emma requested to schedule follow up appointment from visit on 03/20/2021. Scheduled for 08/20/21.   Also requesting next steps from CT lung cancer screening. Advised that patient will need repeat screening in one year.   No other questions at this time.   Talbot Grumbling, RN

## 2021-08-20 ENCOUNTER — Ambulatory Visit (INDEPENDENT_AMBULATORY_CARE_PROVIDER_SITE_OTHER): Payer: Self-pay | Admitting: Family Medicine

## 2021-08-20 ENCOUNTER — Other Ambulatory Visit: Payer: Self-pay

## 2021-08-20 ENCOUNTER — Encounter: Payer: Self-pay | Admitting: Family Medicine

## 2021-08-20 DIAGNOSIS — F17200 Nicotine dependence, unspecified, uncomplicated: Secondary | ICD-10-CM

## 2021-08-20 MED ORDER — CHANTIX STARTING MONTH PAK 0.5 MG X 11 & 1 MG X 42 PO TBPK: 1.0000 | ORAL_TABLET | Freq: Two times a day (BID) | ORAL | 0 refills | Status: DC

## 2021-08-20 NOTE — Patient Instructions (Signed)
It was good seeing you today.  Regarding the results from your CT scan it was normal other than showing some emphysema.  I am glad you are considering quitting smoking.  I prescribed Chantix which she will take 0.5 mg twice daily for 5 days followed by 1 mg twice daily after that.  If you have any questions or concerns please call clinic.  I hope you have a great afternoon!

## 2021-08-20 NOTE — Progress Notes (Signed)
° ° °  SUBJECTIVE:   CHIEF COMPLAINT / HPI:   Smoking cessation follow-up Patient presents today to discuss results from low-dose CT lung cancer screening.  Low-dose CT showed no signs of malignancy and indicated follow-up in 1 year.  Patient currently smoking 10 cigarettes daily.  Is not extremely interested in quitting but after discussion with case manager along with the patient he is agreeable to attempt to quit.  He is open to medications to assist with this.  OBJECTIVE:   BP 128/86    Pulse 74    Wt 173 lb (78.5 kg)    SpO2 97%    BMI 20.51 kg/m   General: Pleasant 61 year old male, no acute distress Cardiac: Regular rate and rhythm, no murmurs appreciated Respiratory: lungs clear to auscultation bilaterally, normal work of breathing, speaking in full sentences MSK: No gross abnormalities  ASSESSMENT/PLAN:   Nicotine use disorder Currently smoking 10 cigarettes a day.  Is interested in quitting.  Prescribed Chantix today for the patient.  He has the starter pack and can titrate up.  We will follow-up in 1 to 2 months regarding his progress.  Provided return precautions.     Gifford Shave, MD [place]

## 2021-08-21 NOTE — Assessment & Plan Note (Signed)
Currently smoking 10 cigarettes a day.  Is interested in quitting.  Prescribed Chantix today for the patient.  He has the starter pack and can titrate up.  We will follow-up in 1 to 2 months regarding his progress.  Provided return precautions.

## 2021-09-02 ENCOUNTER — Telehealth: Payer: Self-pay

## 2021-09-02 DIAGNOSIS — F17200 Nicotine dependence, unspecified, uncomplicated: Secondary | ICD-10-CM

## 2021-09-02 NOTE — Telephone Encounter (Signed)
Patient contacted for follow/up of tobacco intake reduction attempt.   Since last contact patient and case manager report they have not yet purchased/picked-up varenicline.  Patient educated on purpose, proper use and potential adverse effects of nausea.  Following instruction patient verbalized understanding of treatment plan.   Most common triggers to use tobacco include; Habit   Patient self-selected goal of decreasing from 10 to 5 cigarettes per day over the next several weeks.  Total time with patient call and documentation of interaction: 13 minutes.  F/U Phone call planned: 2 weeks.

## 2021-09-02 NOTE — Assessment & Plan Note (Signed)
Patient contacted for follow/up of tobacco intake reduction attempt.   Since last contact patient and case manager report they have not yet purchased/picked-up varenicline.  Patient educated on purpose, proper use and potential adverse effects of nausea.  Following instruction patient verbalized understanding of treatment plan.   Most common triggers to use tobacco include; Habit   Patient self-selected goal of decreasing from 10 to 5 cigarettes per day over the next several weeks.

## 2021-09-02 NOTE — Telephone Encounter (Signed)
Patient and Case manager, Terrence Dupont calls nurse line stating they are returning a call to Dr. Valentina Lucks regarding smoking cessation.   Will forward to Dr. Valentina Lucks for further advisement.   Requesting returned phone call at [phone] after 1:00 pm.   Talbot Grumbling, RN

## 2021-09-02 NOTE — Telephone Encounter (Signed)
Noted and agree. 

## 2021-09-17 ENCOUNTER — Telehealth: Payer: Self-pay | Admitting: Pharmacist

## 2021-09-17 MED ORDER — VARENICLINE TARTRATE 0.5 MG PO TABS: 0.5000 mg | ORAL_TABLET | Freq: Two times a day (BID) | ORAL | 3 refills | Status: DC

## 2021-09-17 NOTE — Telephone Encounter (Signed)
-----   Message from Leavy Cella, [place] sent at 09/02/2021  1:13 PM EST ----- ?Regarding: Cut back from 10 to 5 with use of varenicline ? ? ?

## 2021-09-17 NOTE — Telephone Encounter (Signed)
Patient contacted for follow/up of tobacco intake reduction attempt.  ? ?Since last contact patient reports he has not received any varenicline.   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  ? ? ?Medications currently being used;  ?Varenicline - Not received by pharmacy from 08/20/2021 order ?  ?Resent prescription to pharmacy for varenicline.  ?0.'5mg'$  once daily then increase to twice daily week #2.  ? ?Total time with patient call and documentation of interaction: 13 minutes. ? ?F/U Phone call planned: 2 days ? ? ?

## 2021-09-17 NOTE — Addendum Note (Signed)
Addended by: Leavy Cella on: 09/17/2021 02:51 PM ? ? Modules accepted: Orders ? ?

## 2021-10-16 ENCOUNTER — Telehealth: Payer: Self-pay | Admitting: Pharmacist

## 2021-10-16 NOTE — Telephone Encounter (Signed)
-----   Message from Leavy Cella, [place] sent at 09/02/2021  1:13 PM EST ----- ?Regarding: Cut back from 10 to 5 with use of varenicline ? ? ?

## 2021-10-16 NOTE — Telephone Encounter (Signed)
Attempted to contact patient for follow-up of tobacco intake reduction / cessation.  ? ? ?Left request for call back to direct phone: [phone] ?Patient did not return call within 2 hours.  ? ? ?Total time with patient call and documentation of interaction: 7 minutes. ? ?Additional F/U Phone call planned: 2 weeks I will try again.  ? ?

## 2021-12-19 ENCOUNTER — Ambulatory Visit (INDEPENDENT_AMBULATORY_CARE_PROVIDER_SITE_OTHER): Payer: Self-pay | Admitting: Family Medicine

## 2021-12-19 ENCOUNTER — Encounter: Payer: Self-pay | Admitting: Family Medicine

## 2021-12-19 VITALS — BP 110/78 | HR 63 | Ht 77.0 in | Wt 175.0 lb

## 2021-12-19 DIAGNOSIS — R634 Abnormal weight loss: Secondary | ICD-10-CM

## 2021-12-19 DIAGNOSIS — H6123 Impacted cerumen, bilateral: Secondary | ICD-10-CM

## 2021-12-19 NOTE — Patient Instructions (Addendum)
I was wonderful taking care of you today.  Regarding your ears you do have wax blockage.  We tried to remove it today but were unsuccessful.  I want you to go home and use the Debrox eardrops twice daily for 5 days.  Please follow-up next week and you can have your ears reexamined.  Regarding your dietary concerns I have attached some information on healthy dietary habits.  If you have any issues or concerns please call the clinic.  Hope you have a wonderful day!

## 2021-12-19 NOTE — Assessment & Plan Note (Signed)
Patient's caregiver is concerned regarding patient's dietary habits.  Are concerned that he is losing weight since last being seen and would like assistance in determining how much he needs to be eating daily.  Per chart review patient has actually gained a few pounds since the previous visit but is approximately 20 pounds less than he was 3 years ago.  Provided handout for nutritional guidance and will follow-up in 1 to 2 months for repeat weight check and consider nutrition referral at that time if continuing to lose weight.

## 2021-12-19 NOTE — Progress Notes (Signed)
    SUBJECTIVE:   CHIEF COMPLAINT / HPI:   Ear concerns Patient presents with his caregiver.  They both report that he has been having issues hearing and they feel he may have wax buildup in his ears.  They would like these evaluated.  Denies any pain in the ears.  Nutritional concerns Patient's caregivers are concerned regarding the patient's dietary habits.  Reports that he will eat ravioli but does not eat a lot of food and they are concerned because they feel like he is losing weight.  Would like guidance on how much he should be eating daily and what types of foods he should be eating.   OBJECTIVE:   BP 110/78   Pulse 63   Ht '6\' 5"'$  (1.956 m)   Wt 175 lb (79.4 kg)   SpO2 100%   BMI 20.75 kg/m   General: Thin but well-appearing 61 year old male in no acute distress HEENT: External auditory canals occluded by cerumen bilaterally Cardiac: Regular rate and rhythm Respiratory: Work of breathing, speaking full sentences MSK: No gross abnormalities   ASSESSMENT/PLAN:   Bilateral impacted cerumen Attempted to irrigate patient's ears bilaterally to help with cerumen infection.  Unsuccessful at removing the wax.  Recommended Debrox for 5 days and follow-up in 1 week for reevaluation and repeat irrigation of the ears.  Patient may require referral to ENT.  Weight loss Patient's caregiver is concerned regarding patient's dietary habits.  Are concerned that he is losing weight since last being seen and would like assistance in determining how much he needs to be eating daily.  Per chart review patient has actually gained a few pounds since the previous visit but is approximately 20 pounds less than he was 3 years ago.  Provided handout for nutritional guidance and will follow-up in 1 to 2 months for repeat weight check and consider nutrition referral at that time if continuing to lose weight.     Gifford Shave, MD [place]

## 2021-12-19 NOTE — Assessment & Plan Note (Signed)
Attempted to irrigate patient's ears bilaterally to help with cerumen infection.  Unsuccessful at removing the wax.  Recommended Debrox for 5 days and follow-up in 1 week for reevaluation and repeat irrigation of the ears.  Patient may require referral to ENT.

## 2021-12-25 NOTE — Patient Instructions (Signed)
It was great seeing you today.  We will plan to see you again in 1 month unless you have any other concerns.  We will also schedule you for an appointment with our clinical pharmacist Dr. Valentina Lucks to discuss smoking cessation resources. We will plan for a repeat chest CT in September.  I sent in a refill for your Atorvostatin medicine to lower your lipids/cholesterol which helps prevent heart attacks and strokes.  Start drinking Ensure or Boost nutritional supplements twice daily between meals.   If you have any questions or concerns, please feel free to call the clinic.    Be well,  Dr. Orvis Brill [facility] [phone]

## 2021-12-25 NOTE — Progress Notes (Signed)
    SUBJECTIVE:   CHIEF COMPLAINT / HPI:   Case manager present for visit whom he is assigned for supportive   Ear wax: Used debrox drops x5 days since last visit. No ear pain, but having continued blockage due to wax.   Tobacco use: No interest in quitting, but is amenable to meeting with Dr. Valentina Lucks to discuss tobacco cessation options/prices of options. Does not have the Chantix- too expensive. Smokes 5-6 cigarettes a day, started at 38. Caseworker asking about results of CT scan in 2022 which showed lung-RADS 2- benign appearance or behavior with recommendation to repeat annually.  Weight loss concerns/ low appetite 24-hr recall suggests intake of ~600-700 kcal:  (Up at  AM) B ( AM)- 1 cup coffee  Snk ( AM)- None L ( PM)- Ham & cheese sandwich  Snk ( PM)- Minute maid juice lemonaide  D ( PM)- 2 eggs  Snk ( PM)- Cheese puffs Typical day? No.  Hyperlipidemia Not taking Atorvostatin, amenable to starting. The 10-year ASCVD risk score (Arnett DK, et al., 2019) is: 8.9%   Values used to calculate the score:     Age: 61 years     Sex: Male     Is Non-Hispanic African American: No     Diabetic: No     Tobacco smoker: Yes     Systolic Blood Pressure: 449 mmHg     Is BP treated: No     HDL Cholesterol: 35 mg/dL     Total Cholesterol: 148 mg/dL   PERTINENT  PMH / PSH: Reviewed  OBJECTIVE:   BP 100/60   Pulse 83   Ht _0  (1.956 m)   Wt 174 lb (78.9 kg)   SpO2 98%   BMI 20.63 kg/m  General: Appears older than stated age, pleasant, in no distress, sitting in chair HEENT: Bilateral impacted cerumen, much improved s/p irrigation with visualization of TMs CV: RRR Resp: Normal WOB on room air Abdomen: Soft, NTND   ASSESSMENT/PLAN:   Bilateral impacted cerumen Irrigated ears bilaterally today and successfully removed ~80% of wax in left ear; unsuccessful attempt in right ear Referral to ENT  Weight loss Low appetite- no red flag sx's for malignancy. Will need f/u on  age appropriate cancer screenings w/ PCP including prostate cancer screening Colonoscopy UTD and without concern for malignancy Provided pt with bag of foods (peanutbutter, microwave mac n cheese, chicken soup) and encouraged to start taking Boost/Ensure supplements BID- patient provided with some today Caloric intake is much lower than demand and concerning for future health deterioration/falls. Will monitor this closely and refer to nutrition/RD if continued decreased appetite/intake  Nicotine use disorder Pre-contemplative stage Will schedule visit with Dr. Valentina Lucks  Screening for hyperlipidemia Refilled Atorvostatin 45m #90 R3     MOrvis Brill DSeven [place]

## 2021-12-26 ENCOUNTER — Encounter: Payer: Self-pay | Admitting: Student

## 2021-12-26 ENCOUNTER — Ambulatory Visit (INDEPENDENT_AMBULATORY_CARE_PROVIDER_SITE_OTHER): Payer: Self-pay | Admitting: Student

## 2021-12-26 ENCOUNTER — Telehealth: Payer: Self-pay | Admitting: *Deleted

## 2021-12-26 VITALS — BP 100/60 | HR 83 | Ht 77.0 in | Wt 174.0 lb

## 2021-12-26 DIAGNOSIS — Z13220 Encounter for screening for lipoid disorders: Secondary | ICD-10-CM

## 2021-12-26 DIAGNOSIS — R634 Abnormal weight loss: Secondary | ICD-10-CM

## 2021-12-26 DIAGNOSIS — F17200 Nicotine dependence, unspecified, uncomplicated: Secondary | ICD-10-CM

## 2021-12-26 DIAGNOSIS — H6123 Impacted cerumen, bilateral: Secondary | ICD-10-CM

## 2021-12-26 MED ORDER — ATORVASTATIN CALCIUM 40 MG PO TABS: 40.0000 mg | ORAL_TABLET | Freq: Every day | ORAL | 3 refills | Status: AC

## 2021-12-26 NOTE — Assessment & Plan Note (Signed)
Pre-contemplative stage Will schedule visit with Dr. Valentina Lucks

## 2021-12-26 NOTE — Assessment & Plan Note (Signed)
Refilled Atorvostatin '40mg'$  #90 R3

## 2021-12-26 NOTE — Assessment & Plan Note (Addendum)
Low appetite- no red flag sx's for malignancy. Will need f/u on age appropriate cancer screenings w/ PCP including prostate cancer screening Colonoscopy UTD and without concern for malignancy Provided pt with bag of foods (peanutbutter, microwave mac n cheese, chicken soup) and encouraged to start taking Boost/Ensure supplements BID- patient provided with some today Caloric intake is much lower than demand and concerning for future health deterioration/falls. Will monitor this closely and refer to nutrition/RD if continued decreased appetite/intake

## 2021-12-26 NOTE — Telephone Encounter (Signed)
LMOVM of patient and case worker Mickel Baas [phone]) that we will have supply of Ensure on Wednesday for them. Christen Bame, CMA

## 2021-12-26 NOTE — Assessment & Plan Note (Addendum)
Irrigated ears bilaterally today and successfully removed ~80% of wax in left ear; unsuccessful attempt in right ear Referral to ENT

## 2022-01-01 NOTE — Telephone Encounter (Signed)
Contacted Mickel Baas.    We now have samples of ensure and coupons.   Mickel Baas will come by to get around 2 pm. Christen Bame, CMA

## 2022-01-02 ENCOUNTER — Encounter: Payer: Self-pay | Admitting: Pharmacist

## 2022-01-02 ENCOUNTER — Ambulatory Visit (INDEPENDENT_AMBULATORY_CARE_PROVIDER_SITE_OTHER): Payer: Self-pay | Admitting: Pharmacist

## 2022-01-02 DIAGNOSIS — F17200 Nicotine dependence, unspecified, uncomplicated: Secondary | ICD-10-CM

## 2022-01-02 MED ORDER — VARENICLINE TARTRATE 0.5 MG PO TABS: 0.5000 mg | ORAL_TABLET | Freq: Two times a day (BID) | ORAL | 3 refills | Status: AC

## 2022-01-02 MED ORDER — VARENICLINE TARTRATE 1 MG PO TABS
1.0000 mg | ORAL_TABLET | Freq: Two times a day (BID) | ORAL | 3 refills | Status: AC
Start: 2022-01-02 — End: ?

## 2022-01-02 MED ORDER — FAMOTIDINE 20 MG PO TABS: 20.0000 mg | ORAL_TABLET | Freq: Two times a day (BID) | ORAL | 2 refills | Status: AC

## 2022-01-02 NOTE — Progress Notes (Signed)
S:  Patient arrives good spirits with his case manager and ambulating without assistance.  Patient arrives for evaluation/assistance with tobacco dependence.  He is accompanied by his case manager:  Mickel Baas.  Patient was referred on 12/26/2021 by Dr. Owens Shark.   Patient was last seen by Primary Care Provider, Dr. Caron Presume, on 12/19/2021.   Age when started using tobacco on a daily basis 61 years old. Brand smoked Marlboros previously, but now creates his own with a machine (starting 2-3 years ago).  Number of cigarettes/day: 5-6.  Estimated nicotine content per cigarette (mg) 1.  Estimated nicotine intake per day 5 mg.     Fagerstrom Score Question Scoring Patient Score  How soon after waking do you smoke your first cigarette? <5 mins (3) 5-30 mins (2) 31-60 mins (1) >60 mins (0) 3  Which cigarette would you most hate to give up? First one in AM (1)  Any other one (0)  1    How many cigarettes do you smoke/day? 10 or less (0) 11-20 (1) 21-30 (2) >30 (3) 0  Do you smoke more during the first few hours after waking? Yes (1) No (0) 0   Total Score 4  Score interpretation: low 1-2, low-to-moderate 3-4, moderate 5-7, high >7  Most recent quit attempt: Never Longest time ever been tobacco free: Never  Medications used in past cessation efforts include: varenicline (did not end up taking, was unsure of cost)   Rates IMPORTANCE of quitting tobacco on 1-10 scale of 3. Rates CONFIDENCE of quitting tobacco on 1-10 scale of 5. ( For 2 days)  Most common triggers to use tobacco include; Mostly habit (in the morning, with lunch, on break from work)   Motivation to quit: saving money  Clinical ASCVD: No  The 10-year ASCVD risk score (Arnett DK, et al., 2019) is: 8.9%   Values used to calculate the score:     Age: 61 years     Sex: Male     Is Non-Hispanic African American: No     Diabetic: No     Tobacco smoker: Yes     Systolic Blood Pressure: 267 mmHg     Is BP treated: No      HDL Cholesterol: 35 mg/dL     Total Cholesterol: 148 mg/dL  Review of Systems  All other systems reviewed and are negative.  Physical Exam Constitutional:      Appearance: He is normal weight.  Pulmonary:     Effort: Pulmonary effort is normal.  Neurological:     General: No focal deficit present.     Mental Status: He is alert.  Psychiatric:        Mood and Affect: Mood normal.        Behavior: Behavior normal.        Thought Content: Thought content normal.     A/P: Tobacco use disorder with moderate nicotine dependence of 60 years duration in a patient who is fair candidate for success.  At this moment, patient feels that quitting smoking is around a 3 out of 10 of importance, but he is willing to try medications to see if they help.  -Initiated varenicline 0.5 mg by mouth once daily with food x7 days, then 0.5 mg by mouth twice daily with food x7 days, then 1 mg by mouth twice daily with food thereafter. Patient counseled on purpose, proper use, and potential adverse effects, including GI upset..  -Goal is to use Varenicline to assist with intake reduction.  -  Long-term goal of revisiting interest in complete cessation.    Written information provided.  F/U phone call in 1 month with Dr. Valentina Lucks.  F/U Pharmacy Visit in August.  Total time in face-to-face counseling 26 minutes.  Patient seen with Berdie Ogren, PharmD Candidate.   Marland Kitchen

## 2022-01-02 NOTE — Assessment & Plan Note (Signed)
Tobacco use disorder with moderate nicotine dependence of 38 years duration in a patient who is fair candidate for success.  At this moment, patient feels that quitting smoking is around a 3 out of 10 of importance, but he is willing to try medications to see if they help.  -Initiated varenicline 0.5 mg by mouth once daily with food x7 days, then 0.5 mg by mouth twice daily with food x7 days, then 1 mg by mouth twice daily with food thereafter. Patient counseled on purpose, proper use, and potential adverse effects, including GI upset..  -Goal is to use Varenicline to assist with intake reduction.  -Long-term goal of revisiting interest in complete cessation.

## 2022-01-02 NOTE — Progress Notes (Signed)
Reviewed: I agree with Dr. Koval's documentation and management. 

## 2022-01-02 NOTE — Patient Instructions (Addendum)
Nice to see you today.   We decided to try varenicline (Chantix) 0.5 mg and see how you feel.   Take one pill (0.5 mg) by mouth with food for one week.  Then, take one pill (0.5 mg) with food in the morning and one pill (0.5 mg) with food in the evening for one week.  Follow-up with Dr. Valentina Lucks in August.

## 2022-01-21 ENCOUNTER — Ambulatory Visit (INDEPENDENT_AMBULATORY_CARE_PROVIDER_SITE_OTHER): Payer: Self-pay | Admitting: Student

## 2022-01-21 ENCOUNTER — Encounter: Payer: Self-pay | Admitting: Student

## 2022-01-21 ENCOUNTER — Other Ambulatory Visit: Payer: Self-pay

## 2022-01-21 DIAGNOSIS — L84 Corns and callosities: Secondary | ICD-10-CM

## 2022-01-21 NOTE — Progress Notes (Signed)
    SUBJECTIVE:   CHIEF COMPLAINT / HPI: R foot pain  61 year old male who presents with recurrent callus of the right foot that started 2 years ago. According to patient it was shaved 2 years ago at the doctors office but reappeared a month later. 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  For the most part patient has been soaking his foot in warm water and using stone to try to shave thick layer of skin on his right foot. Denies any tenderness.  PERTINENT  PMH / PSH: Bilateral impacted cerumen, osteoarthritis, foot callus, CKD  OBJECTIVE:   BP 109/73   Pulse 65   Wt 180 lb 6.4 oz (81.8 kg)   SpO2 99%   BMI 21.39 kg/m     Physical Exam General: Alert, well appearing, NAD Cardiovascular: RRR, No Murmurs, Normal S2/S2 Respiratory: CTAB, No wheezing or Rales Extremities: No edema on extremities, Palpable pedal pulse bilaterally.  Fifth metatarsal plantar surface of the right foot with about 2.5cm area of  thickened skin     ASSESSMENT/PLAN:   Foot callus Patient with chief complaint of right foot callus presents today for recurrence.  Denies any tenderness but reports intermittent pain and discomfort.  On exam has good pedal pulse bilaterally and found to have thickened layers of skin on the right foot consistent with callus. Shaved layers of skin of the callus off (Performed by Dr. Andria Frames) and provided patient with handout on how to manage callus. Encouraged patient to get extra padding for shoe or change his boot. Recommend use of Tylenol or ibuprofen for significant pain. Discussed increased risk of reoccurrence and might need possible podiatry referral in future.  Patient is amendable to plan.     Alen Bleacher, MD [place]

## 2022-01-21 NOTE — Assessment & Plan Note (Addendum)
Patient with chief complaint of right foot callus presents today for recurrence.  Denies any tenderness but reports intermittent pain and discomfort.  On exam has good pedal pulse bilaterally and found to have thickened layers of skin on the right foot consistent with callus. Shaved layers of skin of the callus off (Performed by Dr. Andria Frames) and provided patient with handout on how to manage callus. Encouraged patient to get extra padding for shoe or change his boot. Recommend use of Tylenol or ibuprofen for significant pain. Discussed increased risk of reoccurrence and might need possible podiatry referral in future.  Patient is amendable to plan.

## 2022-01-21 NOTE — Patient Instructions (Signed)
Corns and Calluses Corns are small areas of thickened skin that form on the top, sides, or tip of a toe. Corns have a cone-shaped core with a point that can press on a nerve below. This causes pain. Calluses are areas of thickened skin that can form anywhere on the body, including the hands, fingers, palms, soles of the feet, and heels. Calluses are usually larger than corns. What are the causes? Corns and calluses are caused by rubbing (friction) or pressure, such as from shoes that are too tight or do not fit properly. What increases the risk? Corns are more likely to develop in people who have misshapen toes (toe deformities), such as hammer toes. Calluses can form with friction to any area of the skin. They are more likely to develop in people who: Work with their hands. Wear shoes that fit poorly, are too tight, or are high-heeled. Have toe deformities. What are the signs or symptoms? Symptoms of a corn or callus include: A hard growth on the skin. Pain or tenderness under the skin. Redness and swelling. Increased discomfort while wearing tight-fitting shoes, if your feet are affected. If a corn or callus becomes infected, symptoms may include: Redness and swelling that gets worse. Pain. Fluid, blood, or pus draining from the corn or callus. How is this diagnosed? Corns and calluses may be diagnosed based on your symptoms, your medical history, and a physical exam. How is this treated? Treatment for corns and calluses may include: Removing the cause of the friction or pressure. This may involve: Changing your shoes. Wearing shoe inserts (orthotics) or other protective layers in your shoes, such as a corn pad. Wearing gloves. Applying medicine to the skin (topical medicine) to help soften skin in the hardened, thickened areas. Removing layers of dead skin with a file to reduce the size of the corn or callus. Removing the corn or callus with a scalpel or laser. Taking antibiotic  medicines, if your corn or callus is infected. Having surgery, if a toe deformity is the cause. Follow these instructions at home:  Take over-the-counter and prescription medicines only as told by your health care provider. If you were prescribed an antibiotic medicine, take it as told by your health care provider. Do not stop taking it even if your condition improves. Wear shoes that fit well. Avoid wearing high-heeled shoes and shoes that are too tight or too loose. Wear any padding, protective layers, gloves, or orthotics as told by your health care provider. Soak your hands or feet. Then use a file or pumice stone to soften your corn or callus. Do this as told by your health care provider. Check your corn or callus every day for signs of infection. Contact a health care provider if: Your symptoms do not improve with treatment. You have redness or swelling that gets worse. Your corn or callus becomes painful. You have fluid, blood, or pus coming from your corn or callus. You have new symptoms. Get help right away if: You develop severe pain with redness. Summary Corns are small areas of thickened skin that form on the top, sides, or tip of a toe. These can be painful. Calluses are areas of thickened skin that can form anywhere on the body, including the hands, fingers, palms, and soles of the feet. Calluses are usually larger than corns. Corns and calluses are caused by rubbing (friction) or pressure, such as from shoes that are too tight or do not fit properly. Treatment may include wearing padding, protective   layers, gloves, or orthotics as told by your health care provider. This information is not intended to replace advice given to you by your health care provider. Make sure you discuss any questions you have with your health care provider. Document Revised: 10/27/2019 Document Reviewed: 10/27/2019 [employer] Patient Education  2023 [employer]  

## 2022-01-31 ENCOUNTER — Encounter: Payer: Self-pay | Admitting: Gastroenterology
